# Patient Record
Sex: Female | Born: 1990 | Race: Asian | Hispanic: No | Marital: Married | Smoking: Never smoker
Health system: Southern US, Community
[De-identification: ages and names within clinical notes are randomized; demographics above are authoritative.]

## PROBLEM LIST (undated history)

## (undated) DIAGNOSIS — E669 Obesity, unspecified: Secondary | ICD-10-CM

## (undated) DIAGNOSIS — G44209 Tension-type headache, unspecified, not intractable: Secondary | ICD-10-CM

## (undated) HISTORY — DX: Tension-type headache, unspecified, not intractable: G44.209

## (undated) HISTORY — DX: Obesity, unspecified: E66.9

---

## 2012-05-19 ENCOUNTER — Emergency Department (HOSPITAL_COMMUNITY)
Admission: EM | Admit: 2012-05-19 | Discharge: 2012-05-19 | Disposition: A | Discharge status: Home / Self Care | Payer: BC Managed Care – PPO | Source: Home / Self Care | Attending: Family Medicine | Admitting: Family Medicine

## 2012-05-19 ENCOUNTER — Encounter (HOSPITAL_COMMUNITY): Payer: Self-pay

## 2012-05-19 DIAGNOSIS — Z3490 Encounter for supervision of normal pregnancy, unspecified, unspecified trimester: Secondary | ICD-10-CM

## 2012-05-19 DIAGNOSIS — Z3201 Encounter for pregnancy test, result positive: Secondary | ICD-10-CM

## 2012-05-19 DIAGNOSIS — Z331 Pregnant state, incidental: Secondary | ICD-10-CM

## 2012-05-19 LAB — POCT URINALYSIS DIP (DEVICE)
Bilirubin Urine: NEGATIVE
Glucose, UA: NEGATIVE mg/dL
Hgb urine dipstick: NEGATIVE
Nitrite: NEGATIVE
Specific Gravity, Urine: 1.015 (ref 1.005–1.030)

## 2012-05-19 LAB — POCT PREGNANCY, URINE: Preg Test, Ur: POSITIVE — AB

## 2012-05-19 NOTE — ED Notes (Signed)
Desires pregnancy test.  LMP 03-13-12.  Denies bleeding or abdominal pain or concerns

## 2012-05-19 NOTE — ED Provider Notes (Signed)
History     CSN: [id number]  Arrival date & time 05/19/12  1026   First MD Initiated Contact with Patient 05/19/12 1041      Chief Complaint  Patient presents with  . Possible Pregnancy    (Consider location/radiation/quality/duration/timing/severity/associated sxs/prior treatment) Patient is a 21 y.o. female presenting with female genitourinary complaint. The history is provided by the patient and the spouse.  Female GU Problem Primary symptoms include no vaginal bleeding. Primary symptoms comment: lmp 6/28 requesting preg test. There has been no fever. She has missed her period. Her LMP was months ago. There is no concern regarding sexually transmitted diseases. She uses nothing for contraception.    History reviewed. No pertinent past medical history.  History reviewed. No pertinent past surgical history.  No family history on file.  History  Substance Use Topics  . Smoking status: Never Smoker   . Smokeless tobacco: Not on file  . Alcohol Use: No    OB History    Grav Para Term Preterm Abortions TAB SAB Ect Mult Living                  Review of Systems  Constitutional: Negative.   Gastrointestinal: Negative.   Genitourinary: Negative for vaginal bleeding, vaginal discharge and vaginal pain.    Allergies  Review of patient's allergies indicates no known allergies.  Home Medications  No current outpatient prescriptions on file.  BP 106/54  Pulse 64  Temp 98.3 F (36.8 C) (Oral)  Resp 16  SpO2 98%  LMP 03/13/2012  Physical Exam  Nursing note and vitals reviewed. Constitutional: She is oriented to person, place, and time. She appears well-developed and well-nourished.  Abdominal: Soft. Bowel sounds are normal. She exhibits no mass. There is no tenderness.  Neurological: She is alert and oriented to person, place, and time.  Skin: Skin is warm and dry.    ED Course  Procedures (including critical care time)  Labs Reviewed  POCT PREGNANCY, URINE -  Abnormal; Notable for the following:    Preg Test, Ur POSITIVE (*)     All other components within normal limits  POCT URINALYSIS DIP (DEVICE)   No results found.   1. Pregnancy       MDM  upreg--pos        Linna Hoff, MD 05/19/12 1123

## 2012-09-16 NOTE — L&D Delivery Note (Signed)
Nicole Knudsen is a 22 y.o. [id number] presenting at [redacted]w[redacted]d in active labor. She progressed to complete dilation and SVD.  Delivery Note At 1:41 PM a viable female was delivered via Vaginal, Spontaneous Delivery (Presentation: ; Occiput Anterior).  APGAR: 8, 9; weight 7 lb 8.6 oz (3419 g).   Placenta status: Intact, Spontaneous.  Cord: 3 vessels with the following complications: .  Cord pH: n/a  Anesthesia: None  Episiotomy: None Lacerations: 2nd degree Suture Repair: 2.0 vicryl Est. Blood Loss (mL): 300  Mom to postpartum.  Baby to nursery-stable.  Napoleon Form 12/21/2012, 5:11 PM

## 2012-09-23 ENCOUNTER — Encounter: Payer: Self-pay | Admitting: *Deleted

## 2012-09-30 ENCOUNTER — Ambulatory Visit (INDEPENDENT_AMBULATORY_CARE_PROVIDER_SITE_OTHER): Payer: BC Managed Care – PPO | Admitting: Advanced Practice Midwife

## 2012-09-30 ENCOUNTER — Encounter: Payer: Self-pay | Admitting: Advanced Practice Midwife

## 2012-09-30 VITALS — BP 100/63 | Temp 97.1°F | Wt 142.0 lb

## 2012-09-30 DIAGNOSIS — Z3493 Encounter for supervision of normal pregnancy, unspecified, third trimester: Secondary | ICD-10-CM

## 2012-09-30 DIAGNOSIS — Z3480 Encounter for supervision of other normal pregnancy, unspecified trimester: Secondary | ICD-10-CM

## 2012-09-30 LAB — POCT URINALYSIS DIP (DEVICE)
Bilirubin Urine: NEGATIVE
Glucose, UA: NEGATIVE mg/dL
Ketones, ur: NEGATIVE mg/dL
Protein, ur: NEGATIVE mg/dL
Specific Gravity, Urine: 1.02 (ref 1.005–1.030)

## 2012-09-30 NOTE — Progress Notes (Signed)
Pulse: 84

## 2012-09-30 NOTE — Progress Notes (Signed)
Pt is here for NOB visit. Translator with her. NOB labs done and GTT today. Will schedule an anatomy ultrasound. She denies any concerns. +FM, occasional BH contractions, denies any LOF or vaginal bleeding. She does have some white discharge. Denies itching or odor.

## 2012-10-01 LAB — OBSTETRIC PANEL
Antibody Screen: NEGATIVE
Basophils Absolute: 0 10*3/uL (ref 0.0–0.1)
Basophils Relative: 0 % (ref 0–1)
Eosinophils Absolute: 0.3 10*3/uL (ref 0.0–0.7)
HCT: 30.1 % — ABNORMAL LOW (ref 36.0–46.0)
Hemoglobin: 9.5 g/dL — ABNORMAL LOW (ref 12.0–15.0)
MCH: 23.6 pg — ABNORMAL LOW (ref 26.0–34.0)
MCHC: 31.6 g/dL (ref 30.0–36.0)
Monocytes Absolute: 0.7 10*3/uL (ref 0.1–1.0)
Monocytes Relative: 6 % (ref 3–12)
Neutro Abs: 8.3 10*3/uL — ABNORMAL HIGH (ref 1.7–7.7)
RDW: 15.1 % (ref 11.5–15.5)
Rh Type: POSITIVE

## 2012-10-01 LAB — GLUCOSE TOLERANCE, 1 HOUR (50G) W/O FASTING: Glucose, 1 Hour GTT: 94 mg/dL (ref 70–140)

## 2012-10-01 LAB — HIV ANTIBODY (ROUTINE TESTING W REFLEX): HIV: NONREACTIVE

## 2012-10-02 ENCOUNTER — Ambulatory Visit (HOSPITAL_COMMUNITY)
Admission: RE | Admit: 2012-10-02 | Discharge: 2012-10-02 | Disposition: A | Discharge status: Home / Self Care | Payer: BC Managed Care – PPO | Source: Ambulatory Visit | Attending: Advanced Practice Midwife | Admitting: Advanced Practice Midwife

## 2012-10-02 DIAGNOSIS — Z1389 Encounter for screening for other disorder: Secondary | ICD-10-CM | POA: Insufficient documentation

## 2012-10-02 DIAGNOSIS — Z3493 Encounter for supervision of normal pregnancy, unspecified, third trimester: Secondary | ICD-10-CM

## 2012-10-02 DIAGNOSIS — O358XX0 Maternal care for other (suspected) fetal abnormality and damage, not applicable or unspecified: Secondary | ICD-10-CM | POA: Insufficient documentation

## 2012-10-02 DIAGNOSIS — Z363 Encounter for antenatal screening for malformations: Secondary | ICD-10-CM | POA: Insufficient documentation

## 2012-10-03 LAB — CULTURE, OB URINE: Colony Count: 40000

## 2012-10-14 ENCOUNTER — Encounter: Payer: Self-pay | Admitting: Advanced Practice Midwife

## 2012-10-14 ENCOUNTER — Ambulatory Visit (INDEPENDENT_AMBULATORY_CARE_PROVIDER_SITE_OTHER): Payer: BC Managed Care – PPO | Admitting: Family Medicine

## 2012-10-14 VITALS — BP 118/63 | Temp 97.2°F | Wt 140.0 lb

## 2012-10-14 DIAGNOSIS — O0930 Supervision of pregnancy with insufficient antenatal care, unspecified trimester: Secondary | ICD-10-CM | POA: Insufficient documentation

## 2012-10-14 LAB — POCT URINALYSIS DIP (DEVICE)
Bilirubin Urine: NEGATIVE
Glucose, UA: NEGATIVE mg/dL
Ketones, ur: NEGATIVE mg/dL
Protein, ur: NEGATIVE mg/dL
Specific Gravity, Urine: 1.015 (ref 1.005–1.030)

## 2012-10-14 NOTE — Progress Notes (Signed)
Pt has no concerns. Wt loss since last visit. F/U next appt.

## 2012-10-14 NOTE — Patient Instructions (Addendum)
Pregnancy - Third Trimester The third trimester of pregnancy (the last 3 months) is a period of the most rapid growth for you and your baby. The baby approaches a length of 20 inches and a weight of 6 to 10 pounds. The baby is adding on fat and getting ready for life outside your body. While inside, babies have periods of sleeping and waking, suck their thumbs, and hiccups. You can often feel small contractions of the uterus. This is false labor. It is also called Braxton-Hicks contractions. This is like a practice for labor. The usual problems in this stage of pregnancy include more difficulty breathing, swelling of the hands and feet from water retention, and having to urinate more often because of the uterus and baby pressing on your bladder.  PRENATAL EXAMS  Blood work may continue to be done during prenatal exams. These tests are done to check on your health and the probable health of your baby. Blood work is used to follow your blood levels (hemoglobin). Anemia (low hemoglobin) is common during pregnancy. Iron and vitamins are given to help prevent this. You may also continue to be checked for diabetes. Some of the past blood tests may be done again.  The size of the uterus is measured during each visit. This makes sure your baby is growing properly according to your pregnancy dates.  Your blood pressure is checked every prenatal visit. This is to make sure you are not getting toxemia.  Your urine is checked every prenatal visit for infection, diabetes and protein.  Your weight is checked at each visit. This is done to make sure gains are happening at the suggested rate and that you and your baby are growing normally.  Sometimes, an ultrasound is performed to confirm the position and the proper growth and development of the baby. This is a test done that bounces harmless sound waves off the baby so your caregiver can more accurately determine due dates.  Discuss the type of pain medication and  anesthesia you will have during your labor and delivery.  Discuss the possibility and anesthesia if a Cesarean Section might be necessary.  Inform your caregiver if there is any mental or physical violence at home. Sometimes, a specialized non-stress test, contraction stress test and biophysical profile are done to make sure the baby is not having a problem. Checking the amniotic fluid surrounding the baby is called an amniocentesis. The amniotic fluid is removed by sticking a needle into the belly (abdomen). This is sometimes done near the end of pregnancy if an early delivery is required. In this case, it is done to help make sure the baby's lungs are mature enough for the baby to live outside of the womb. If the lungs are not mature and it is unsafe to deliver the baby, an injection of cortisone medication is given to the mother 1 to 2 days before the delivery. This helps the baby's lungs mature and makes it safer to deliver the baby. CHANGES OCCURING IN THE THIRD TRIMESTER OF PREGNANCY Your body goes through many changes during pregnancy. They vary from person to person. Talk to your caregiver about changes you notice and are concerned about.  During the last trimester, you have probably had an increase in your appetite. It is normal to have cravings for certain foods. This varies from person to person and pregnancy to pregnancy.  You may begin to get stretch marks on your hips, abdomen, and breasts. These are normal changes in the body   during pregnancy. There are no exercises or medications to take which prevent this change.  Constipation may be treated with a stool softener or adding bulk to your diet. Drinking lots of fluids, fiber in vegetables, fruits, and whole grains are helpful.  Exercising is also helpful. If you have been very active up until your pregnancy, most of these activities can be continued during your pregnancy. If you have been less active, it is helpful to start an exercise  program such as walking. Consult your caregiver before starting exercise programs.  Avoid all smoking, alcohol, un-prescribed drugs, herbs and "street drugs" during your pregnancy. These chemicals affect the formation and growth of the baby. Avoid chemicals throughout the pregnancy to ensure the delivery of a healthy infant.  Backache, varicose veins and hemorrhoids may develop or get worse.  You will tire more easily in the third trimester, which is normal.  The baby's movements may be stronger and more often.  You may become short of breath easily.  Your belly button may stick out.  A yellow discharge may leak from your breasts called colostrum.  You may have a bloody mucus discharge. This usually occurs a few days to a week before labor begins. HOME CARE INSTRUCTIONS   Keep your caregiver's appointments. Follow your caregiver's instructions regarding medication use, exercise, and diet.  During pregnancy, you are providing food for you and your baby. Continue to eat regular, well-balanced meals. Choose foods such as meat, fish, milk and other low fat dairy products, vegetables, fruits, and whole-grain breads and cereals. Your caregiver will tell you of the ideal weight gain.  A physical sexual relationship may be continued throughout pregnancy if there are no other problems such as early (premature) leaking of amniotic fluid from the membranes, vaginal bleeding, or belly (abdominal) pain.  Exercise regularly if there are no restrictions. Check with your caregiver if you are unsure of the safety of your exercises. Greater weight gain will occur in the last 2 trimesters of pregnancy. Exercising helps:  Control your weight.  Get you in shape for labor and delivery.  You lose weight after you deliver.  Rest a lot with legs elevated, or as needed for leg cramps or low back pain.  Wear a good support or jogging bra for breast tenderness during pregnancy. This may help if worn during  sleep. Pads or tissues may be used in the bra if you are leaking colostrum.  Do not use hot tubs, steam rooms, or saunas.  Wear your seat belt when driving. This protects you and your baby if you are in an accident.  Avoid raw meat, cat litter boxes and soil used by cats. These carry germs that can cause birth defects in the baby.  It is easier to loose urine during pregnancy. Tightening up and strengthening the pelvic muscles will help with this problem. You can practice stopping your urination while you are going to the bathroom. These are the same muscles you need to strengthen. It is also the muscles you would use if you were trying to stop from passing gas. You can practice tightening these muscles up 10 times a set and repeating this about 3 times per day. Once you know what muscles to tighten up, do not perform these exercises during urination. It is more likely to cause an infection by backing up the urine.  Ask for help if you have financial, counseling or nutritional needs during pregnancy. Your caregiver will be able to offer counseling for these   needs as well as refer you for other special needs.  Make a list of emergency phone numbers and have them available.  Plan on getting help from family or friends when you go home from the hospital.  Make a trial run to the hospital.  Take prenatal classes with the father to understand, practice and ask questions about the labor and delivery.  Prepare the baby's room/nursery.  Do not travel out of the city unless it is absolutely necessary and with the advice of your caregiver.  Wear only low or no heal shoes to have better balance and prevent falling. MEDICATIONS AND DRUG USE IN PREGNANCY  Take prenatal vitamins as directed. The vitamin should contain 1 milligram of folic acid. Keep all vitamins out of reach of children. Only a couple vitamins or tablets containing iron may be fatal to a baby or young child when ingested.  Avoid use  of all medications, including herbs, over-the-counter medications, not prescribed or suggested by your caregiver. Only take over-the-counter or prescription medicines for pain, discomfort, or fever as directed by your caregiver. Do not use aspirin, ibuprofen (Motrin, Advil, Nuprin) or naproxen (Aleve) unless OK'd by your caregiver.  Let your caregiver also know about herbs you may be using.  Alcohol is related to a number of birth defects. This includes fetal alcohol syndrome. All alcohol, in any form, should be avoided completely. Smoking will cause low birth rate and premature babies.  Street/illegal drugs are very harmful to the baby. They are absolutely forbidden. A baby born to an addicted mother will be addicted at birth. The baby will go through the same withdrawal an adult does. SEEK MEDICAL CARE IF: You have any concerns or worries during your pregnancy. It is better to call with your questions if you feel they cannot wait, rather than worry about them. DECISIONS ABOUT CIRCUMCISION You may or may not know the sex of your baby. If you know your baby is a boy, it may be time to think about circumcision. Circumcision is the removal of the foreskin of the penis. This is the skin that covers the sensitive end of the penis. There is no proven medical need for this. Often this decision is made on what is popular at the time or based upon religious beliefs and social issues. You can discuss these issues with your caregiver or pediatrician. SEEK IMMEDIATE MEDICAL CARE IF:   An unexplained oral temperature above 102 F (38.9 C) develops, or as your caregiver suggests.  You have leaking of fluid from the vagina (birth canal). If leaking membranes are suspected, take your temperature and tell your caregiver of this when you call.  There is vaginal spotting, bleeding or passing clots. Tell your caregiver of the amount and how many pads are used.  You develop a bad smelling vaginal discharge with  a change in the color from clear to white.  You develop vomiting that lasts more than 24 hours.  You develop chills or fever.  You develop shortness of breath.  You develop burning on urination.  You loose more than 2 pounds of weight or gain more than 2 pounds of weight or as suggested by your caregiver.  You notice sudden swelling of your face, hands, and feet or legs.  You develop belly (abdominal) pain. Round ligament discomfort is a common non-cancerous (benign) cause of abdominal pain in pregnancy. Your caregiver still must evaluate you.  You develop a severe headache that does not go away.  You develop visual   problems, blurred or double vision.  If you have not felt your baby move for more than 1 hour. If you think the baby is not moving as much as usual, eat something with sugar in it and lie down on your left side for an hour. The baby should move at least 4 to 5 times per hour. Call right away if your baby moves less than that.  You fall, are in a car accident or any kind of trauma.  There is mental or physical violence at home. Document Released: 08/27/2001 Document Revised: 11/25/2011 Document Reviewed: 03/01/2009 [employer] Patient Information 2013 [employer] Fetal Monitoring, Fetal Movement Assessment Fetal movement assessment (FMA) is done by the pregnant woman herself by counting and recording the baby's movements over a certain time period. It is done to see if there are problems with the pregnancy and the baby. Identifying and correcting problems may prevent serious problems from developing with the fetus, including fetal loss. Some pregnancies are complicated by the mother's medical problems. Some of these problems are type 1 diabetes mellitus, high blood pressure and other chronic medical illnesses. This is why it is important to monitor the baby before birth.  OTHER TECHNIQUES OF MONITORING YOUR BABY BEFORE BIRTH Several tests are in use. These  include:  Nonstress test (NST). This test monitors the baby's heart rate when the baby moves.  Contraction stress test (CST). This test monitors the baby's heart rate during a contraction of the uterus.  Fetal biophysical profile (BPP). This measures and evaluates 5 observations of the baby:  The nonstress test.  The baby's breathing.  The baby's movements.  The baby's muscle tone.  The amount of amniotic fluid.  Modified BPP. This measures the volume of fluid in different parts of the amniotic sac (amniotic fluid index) and the results of the nonstress test.  Umbilical artery doppler velocimetry. This evaluates the blood flow through the umbilical cord. There are several very serious problems that cannot be predicted or detected with any of the fetal monitoring procedures. These problems include separation (abruption) of the placenta or when the fetus chokes on the umbilical cord (umbilical cord accident). Your caregiver will help you understand your tests and what they mean for you and your baby. It is your responsibility to obtain the results of your test. LET YOUR CAREGIVER KNOW ABOUT:   Any medications you are taking including prescription and over-the-counter drugs, herbs, eye drops and creams.  If you have a fever.  If you have an infection.  If you are sick. RISKS AND COMPLICATIONS  There are no risks or complications to the mother or fetus with FMA. BEFORE THE PROCEDURE  Do not take medications that may decrease or increase the baby's heart rate and/or movements.  Eat a full meal at least 2 hours before the test.  Do not smoke if you are pregnant. If you smoke, stop at least 2 days before the test. It is best not to smoke at all when you are pregnant. PROCEDURE Sometimes, a mother notices her baby moves less before there are problems. Because of this, it is believed that fetal movement checking by the mother (kick counts) is a good way to check the baby before  birth. There are different ways of doing this. Two good ways are:  The woman lies on her side and counts distinct (individual) fetal movements. A feeling of 10 distinct movements in a period of up to 2 hours is considered reassuring. When 10 movements are felt,   you may stop counting.  Women are instructed to count fetal movements for 1 hour, three times per week. The count is good if, after one week, it equals or is over the woman's previously established baseline count. If the count is lower, further checking of your baby is needed. AFTER THE PROCEDURE You may resume your usual activities. HOME CARE INSTRUCTIONS   Follow your caregiver's advice and recommendations.  Be aware of your baby's movements. Are they normal, less than usual or more than usual?  Make and keep the rest of your prenatal appointments. SEEK MEDICAL CARE IF:   You develop a temperature of 100 F (37.8 C) or higher.  You have a bloody mucus discharge from the vagina (a bloody show). SEEK IMMEDIATE MEDICAL CARE IF:   You do not feel the baby move.  You think the baby's movements are too little or too many.  You develop contractions.  You develop vaginal bleeding.  You have belly (abdominal) pain.  You have leaking or a gush of fluid from the vagina. Document Released: 08/23/2002 Document Revised: 11/25/2011 Document Reviewed: 12/26/2008 [employer] Patient Information 2013 [employer].  

## 2012-10-14 NOTE — Progress Notes (Signed)
Pulse: 88

## 2012-11-04 ENCOUNTER — Encounter: Payer: Self-pay | Admitting: Advanced Practice Midwife

## 2012-11-04 ENCOUNTER — Ambulatory Visit (INDEPENDENT_AMBULATORY_CARE_PROVIDER_SITE_OTHER): Payer: BC Managed Care – PPO | Admitting: Advanced Practice Midwife

## 2012-11-04 VITALS — BP 105/56 | Temp 97.1°F | Wt 141.7 lb

## 2012-11-04 DIAGNOSIS — O0930 Supervision of pregnancy with insufficient antenatal care, unspecified trimester: Secondary | ICD-10-CM

## 2012-11-04 DIAGNOSIS — O9989 Other specified diseases and conditions complicating pregnancy, childbirth and the puerperium: Secondary | ICD-10-CM

## 2012-11-04 DIAGNOSIS — O2613 Low weight gain in pregnancy, third trimester: Secondary | ICD-10-CM

## 2012-11-04 LAB — POCT URINALYSIS DIP (DEVICE)
Bilirubin Urine: NEGATIVE
Glucose, UA: NEGATIVE mg/dL
Hgb urine dipstick: NEGATIVE
Specific Gravity, Urine: 1.015 (ref 1.005–1.030)
Urobilinogen, UA: 0.2 mg/dL (ref 0.0–1.0)

## 2012-11-04 NOTE — Patient Instructions (Signed)
Pregnancy - Third Trimester  The third trimester of pregnancy (the last 3 months) is a period of the most rapid growth for you and your baby. The baby approaches a length of 20 inches and a weight of 6 to 10 pounds. The baby is adding on fat and getting ready for life outside your body. While inside, babies have periods of sleeping and waking, suck their thumbs, and hiccups. You can often feel small contractions of the uterus. This is false labor. It is also called Braxton-Hicks contractions. This is like a practice for labor. The usual problems in this stage of pregnancy include more difficulty breathing, swelling of the hands and feet from water retention, and having to urinate more often because of the uterus and baby pressing on your bladder.   PRENATAL EXAMS  · Blood work may continue to be done during prenatal exams. These tests are done to check on your health and the probable health of your baby. Blood work is used to follow your blood levels (hemoglobin). Anemia (low hemoglobin) is common during pregnancy. Iron and vitamins are given to help prevent this. You may also continue to be checked for diabetes. Some of the past blood tests may be done again.  · The size of the uterus is measured during each visit. This makes sure your baby is growing properly according to your pregnancy dates.  · Your blood pressure is checked every prenatal visit. This is to make sure you are not getting toxemia.  · Your urine is checked every prenatal visit for infection, diabetes and protein.  · Your weight is checked at each visit. This is done to make sure gains are happening at the suggested rate and that you and your baby are growing normally.  · Sometimes, an ultrasound is performed to confirm the position and the proper growth and development of the baby. This is a test done that bounces harmless sound waves off the baby so your caregiver can more accurately determine due dates.  · Discuss the type of pain medication and  anesthesia you will have during your labor and delivery.  · Discuss the possibility and anesthesia if a Cesarean Section might be necessary.  · Inform your caregiver if there is any mental or physical violence at home.  Sometimes, a specialized non-stress test, contraction stress test and biophysical profile are done to make sure the baby is not having a problem. Checking the amniotic fluid surrounding the baby is called an amniocentesis. The amniotic fluid is removed by sticking a needle into the belly (abdomen). This is sometimes done near the end of pregnancy if an early delivery is required. In this case, it is done to help make sure the baby's lungs are mature enough for the baby to live outside of the womb. If the lungs are not mature and it is unsafe to deliver the baby, an injection of cortisone medication is given to the mother 1 to 2 days before the delivery. This helps the baby's lungs mature and makes it safer to deliver the baby.  CHANGES OCCURING IN THE THIRD TRIMESTER OF PREGNANCY  Your body goes through many changes during pregnancy. They vary from person to person. Talk to your caregiver about changes you notice and are concerned about.  · During the last trimester, you have probably had an increase in your appetite. It is normal to have cravings for certain foods. This varies from person to person and pregnancy to pregnancy.  · You may begin to   get stretch marks on your hips, abdomen, and breasts. These are normal changes in the body during pregnancy. There are no exercises or medications to take which prevent this change.  · Constipation may be treated with a stool softener or adding bulk to your diet. Drinking lots of fluids, fiber in vegetables, fruits, and whole grains are helpful.  · Exercising is also helpful. If you have been very active up until your pregnancy, most of these activities can be continued during your pregnancy. If you have been less active, it is helpful to start an exercise  program such as walking. Consult your caregiver before starting exercise programs.  · Avoid all smoking, alcohol, un-prescribed drugs, herbs and "street drugs" during your pregnancy. These chemicals affect the formation and growth of the baby. Avoid chemicals throughout the pregnancy to ensure the delivery of a healthy infant.  · Backache, varicose veins and hemorrhoids may develop or get worse.  · You will tire more easily in the third trimester, which is normal.  · The baby's movements may be stronger and more often.  · You may become short of breath easily.  · Your belly button may stick out.  · A yellow discharge may leak from your breasts called colostrum.  · You may have a bloody mucus discharge. This usually occurs a few days to a week before labor begins.  HOME CARE INSTRUCTIONS   · Keep your caregiver's appointments. Follow your caregiver's instructions regarding medication use, exercise, and diet.  · During pregnancy, you are providing food for you and your baby. Continue to eat regular, well-balanced meals. Choose foods such as meat, fish, milk and other low fat dairy products, vegetables, fruits, and whole-grain breads and cereals. Your caregiver will tell you of the ideal weight gain.  · A physical sexual relationship may be continued throughout pregnancy if there are no other problems such as early (premature) leaking of amniotic fluid from the membranes, vaginal bleeding, or belly (abdominal) pain.  · Exercise regularly if there are no restrictions. Check with your caregiver if you are unsure of the safety of your exercises. Greater weight gain will occur in the last 2 trimesters of pregnancy. Exercising helps:  · Control your weight.  · Get you in shape for labor and delivery.  · You lose weight after you deliver.  · Rest a lot with legs elevated, or as needed for leg cramps or low back pain.  · Wear a good support or jogging bra for breast tenderness during pregnancy. This may help if worn during  sleep. Pads or tissues may be used in the bra if you are leaking colostrum.  · Do not use hot tubs, steam rooms, or saunas.  · Wear your seat belt when driving. This protects you and your baby if you are in an accident.  · Avoid raw meat, cat litter boxes and soil used by cats. These carry germs that can cause birth defects in the baby.  · It is easier to loose urine during pregnancy. Tightening up and strengthening the pelvic muscles will help with this problem. You can practice stopping your urination while you are going to the bathroom. These are the same muscles you need to strengthen. It is also the muscles you would use if you were trying to stop from passing gas. You can practice tightening these muscles up 10 times a set and repeating this about 3 times per day. Once you know what muscles to tighten up, do not perform these   exercises during urination. It is more likely to cause an infection by backing up the urine.  · Ask for help if you have financial, counseling or nutritional needs during pregnancy. Your caregiver will be able to offer counseling for these needs as well as refer you for other special needs.  · Make a list of emergency phone numbers and have them available.  · Plan on getting help from family or friends when you go home from the hospital.  · Make a trial run to the hospital.  · Take prenatal classes with the father to understand, practice and ask questions about the labor and delivery.  · Prepare the baby's room/nursery.  · Do not travel out of the city unless it is absolutely necessary and with the advice of your caregiver.  · Wear only low or no heal shoes to have better balance and prevent falling.  MEDICATIONS AND DRUG USE IN PREGNANCY  · Take prenatal vitamins as directed. The vitamin should contain 1 milligram of folic acid. Keep all vitamins out of reach of children. Only a couple vitamins or tablets containing iron may be fatal to a baby or young child when ingested.  · Avoid use  of all medications, including herbs, over-the-counter medications, not prescribed or suggested by your caregiver. Only take over-the-counter or prescription medicines for pain, discomfort, or fever as directed by your caregiver. Do not use aspirin, ibuprofen (Motrin®, Advil®, Nuprin®) or naproxen (Aleve®) unless OK'd by your caregiver.  · Let your caregiver also know about herbs you may be using.  · Alcohol is related to a number of birth defects. This includes fetal alcohol syndrome. All alcohol, in any form, should be avoided completely. Smoking will cause low birth rate and premature babies.  · Street/illegal drugs are very harmful to the baby. They are absolutely forbidden. A baby born to an addicted mother will be addicted at birth. The baby will go through the same withdrawal an adult does.  SEEK MEDICAL CARE IF:  You have any concerns or worries during your pregnancy. It is better to call with your questions if you feel they cannot wait, rather than worry about them.  DECISIONS ABOUT CIRCUMCISION  You may or may not know the sex of your baby. If you know your baby is a boy, it may be time to think about circumcision. Circumcision is the removal of the foreskin of the penis. This is the skin that covers the sensitive end of the penis. There is no proven medical need for this. Often this decision is made on what is popular at the time or based upon religious beliefs and social issues. You can discuss these issues with your caregiver or pediatrician.  SEEK IMMEDIATE MEDICAL CARE IF:   · An unexplained oral temperature above 102° F (38.9° C) develops, or as your caregiver suggests.  · You have leaking of fluid from the vagina (birth canal). If leaking membranes are suspected, take your temperature and tell your caregiver of this when you call.  · There is vaginal spotting, bleeding or passing clots. Tell your caregiver of the amount and how many pads are used.  · You develop a bad smelling vaginal discharge with  a change in the color from clear to white.  · You develop vomiting that lasts more than 24 hours.  · You develop chills or fever.  · You develop shortness of breath.  · You develop burning on urination.  · You loose more than 2 pounds of weight   or gain more than 2 pounds of weight or as suggested by your caregiver.  · You notice sudden swelling of your face, hands, and feet or legs.  · You develop belly (abdominal) pain. Round ligament discomfort is a common non-cancerous (benign) cause of abdominal pain in pregnancy. Your caregiver still must evaluate you.  · You develop a severe headache that does not go away.  · You develop visual problems, blurred or double vision.  · If you have not felt your baby move for more than 1 hour. If you think the baby is not moving as much as usual, eat something with sugar in it and lie down on your left side for an hour. The baby should move at least 4 to 5 times per hour. Call right away if your baby moves less than that.  · You fall, are in a car accident or any kind of trauma.  · There is mental or physical violence at home.  Document Released: 08/27/2001 Document Revised: 11/25/2011 Document Reviewed: 03/01/2009  [employer]® Patient Information ©2013 [employer].

## 2012-11-04 NOTE — Progress Notes (Signed)
Doing well. Denies pain to me. Reviewed where to go if she has problems. Low weight gain and decreased FH. Will check growth [place]

## 2012-11-04 NOTE — Progress Notes (Signed)
Pulse- 77   Pain-lower back

## 2012-11-12 ENCOUNTER — Ambulatory Visit (HOSPITAL_COMMUNITY)
Admission: RE | Admit: 2012-11-12 | Discharge: 2012-11-12 | Disposition: A | Discharge status: Home / Self Care | Payer: BC Managed Care – PPO | Source: Ambulatory Visit | Attending: Advanced Practice Midwife | Admitting: Advanced Practice Midwife

## 2012-11-12 DIAGNOSIS — Z3689 Encounter for other specified antenatal screening: Secondary | ICD-10-CM | POA: Insufficient documentation

## 2012-11-12 DIAGNOSIS — O2613 Low weight gain in pregnancy, third trimester: Secondary | ICD-10-CM

## 2012-11-16 ENCOUNTER — Encounter: Payer: Self-pay | Admitting: Advanced Practice Midwife

## 2012-11-16 DIAGNOSIS — O2610 Low weight gain in pregnancy, unspecified trimester: Secondary | ICD-10-CM | POA: Insufficient documentation

## 2012-11-16 HISTORY — DX: Low weight gain in pregnancy, unspecified trimester: O26.10

## 2012-11-18 ENCOUNTER — Encounter: Payer: Self-pay | Admitting: Family

## 2012-11-18 ENCOUNTER — Other Ambulatory Visit: Payer: Self-pay | Admitting: Advanced Practice Midwife

## 2012-11-18 ENCOUNTER — Ambulatory Visit (INDEPENDENT_AMBULATORY_CARE_PROVIDER_SITE_OTHER): Payer: BC Managed Care – PPO | Admitting: Family

## 2012-11-18 VITALS — BP 111/71 | Temp 97.3°F | Wt 144.8 lb

## 2012-11-18 DIAGNOSIS — O0933 Supervision of pregnancy with insufficient antenatal care, third trimester: Secondary | ICD-10-CM

## 2012-11-18 DIAGNOSIS — O0930 Supervision of pregnancy with insufficient antenatal care, unspecified trimester: Secondary | ICD-10-CM

## 2012-11-18 LAB — POCT URINALYSIS DIP (DEVICE)
Glucose, UA: NEGATIVE mg/dL
Ketones, ur: NEGATIVE mg/dL
Protein, ur: NEGATIVE mg/dL
Specific Gravity, Urine: 1.015 (ref 1.005–1.030)
Urobilinogen, UA: 0.2 mg/dL (ref 0.0–1.0)

## 2012-11-18 LAB — OB RESULTS CONSOLE GBS: GBS: POSITIVE

## 2012-11-18 NOTE — Progress Notes (Signed)
Pulse- 74 Patient reports some abdominal pain

## 2012-11-18 NOTE — Progress Notes (Signed)
Pt with no complaints today, reporting normal fetal movement and no bleeding, loss of fluid, or contractions. Gained 3 lbs since 2/19 visit. Today, GC/Chlamydia via speculum exam and GBS screen performed. Pt would like [place] to call husb phone at [phone] if abnormal. Visit translated by interpreter.

## 2012-11-23 LAB — CULTURE, BETA STREP (GROUP B ONLY)

## 2012-11-25 ENCOUNTER — Ambulatory Visit (INDEPENDENT_AMBULATORY_CARE_PROVIDER_SITE_OTHER): Payer: BC Managed Care – PPO | Admitting: Advanced Practice Midwife

## 2012-11-25 ENCOUNTER — Other Ambulatory Visit: Payer: Self-pay | Admitting: Family Medicine

## 2012-11-25 ENCOUNTER — Encounter: Payer: Self-pay | Admitting: Advanced Practice Midwife

## 2012-11-25 VITALS — BP 99/61 | Temp 97.3°F | Wt 145.5 lb

## 2012-11-25 DIAGNOSIS — Z3483 Encounter for supervision of other normal pregnancy, third trimester: Secondary | ICD-10-CM

## 2012-11-25 DIAGNOSIS — Z3480 Encounter for supervision of other normal pregnancy, unspecified trimester: Secondary | ICD-10-CM

## 2012-11-25 LAB — POCT URINALYSIS DIP (DEVICE)
Bilirubin Urine: NEGATIVE
Glucose, UA: NEGATIVE mg/dL
Nitrite: NEGATIVE
Specific Gravity, Urine: 1.015 (ref 1.005–1.030)
Urobilinogen, UA: 0.2 mg/dL (ref 0.0–1.0)

## 2012-11-25 NOTE — Progress Notes (Signed)
No complaint, feeling well. Discussed GBS +, and handout given.

## 2012-11-25 NOTE — Patient Instructions (Addendum)
Nhi?m Trng Khu?n Lin C?u Nhm B Trong Khi Mang [place]  (Group B Streptococcus Infection During Pregnancy) Khu?n lin c?u Nhm B (GBS) l m?t lo?i vi khu?n th??ng ???c tm th?y ? ph? n? kh?e m?nh. GBS th??ng khng gy ra b?t k? tri?u ch?ng ho?c t?n h?i no cho ph? n? tr??ng thnh kh?e m?nh, nh?ng vi khu?n ny c th? lm cho em b b? b?nh r?t n?ng n?u n ly sang em b trong khi sinh. GBS khng ph?i l b?nh ly truy?n qua ???ng tnh d?c (STD). GBS khc v?i khu?n lin c?u Nhm A, vi khu?n gy b?nh "vim h?ng khu?n lin c?u".  NGUYN NHN  Vi khu?n GBS c th? ???c tm th?y trong ???ng ru?t, c? quan sinh s?n v ???ng ti?t ni?u c?a ph? n?. N c?ng c th? ???c tm th?y trong ???ng sinh d?c n?, th??ng xuyn nh?t trong cc khu v?c m ??o v tr?c trng.  TRI?U CH?NG  Trong thai k?, GBS c th? ? nh?ng n?i sau ?y:  ???ng b? ph?n sinh d?c m khng c tri?u ch?ng.  Tr?c trng m khng c tri?u ch?ng.  N??c ti?u c ho?c khng c tri?u ch?ng (nhi?m trng khng c tri?u ch?ng).  Cc tri?u ch?ng c th? bao g?m ?au, t?n su?t, m?c ?? c?p bch v ra mu khi ?i ti?u (vim bng quang). Ph? n? mang thai b? nhi?m GBS c nguy c? gia t?ng:   ?au ?? v ?? s?m.  V? ?i ko di.  Nhi?m trng ? cc b? ph?n sau:  Bng quang.  Th?n (vim b? th?n).  Ti n??c ho?c nhau thai (nhi?m trng ?i).  T? cung sau khi sinh. Tr? m?i sinh b? nhi?m GBS c th? t?ng nguy c?:   Nhi?m khu?n ph?i (vim ph?i).  Nhi?m trng mu (nhi?m khu?n huy?t).  Nhi?m trng mng no v t?y s?ng (vim mng no). CH?N ?ON  Ch?n ?on nhi?m GBS ???c th?c hi?n b?ng cc xt nghi?m sng l?c ???c th?c hi?n ? tu?n th? 35 ??n 37 c?a thai k?[place] nghi?m (c?y) th?t d? dng, dng m?t t?m bng qu?t m ??o v tr?c trng. M?u n??c ti?u c?ng c th? ???c ki?m tra ?? tm vi khu?n. Ni chuy?n v?i chuyn gia ch?m [place] y t? c?a b?n v? m?t k? ho?ch ?au ?? n?u xt nghi?m cho th?y b?n mang vi khu?n GBS.  ?I?U TR?  N?u k?t qu? c?y d??ng tnh, cho bi?t b? nhi?m GBS, b?n  c th? s? ???c ?i?u tr? thu?c khng sinh trong qu trnh ?au ??. Cch ny s? gip ng?n khng cho GBS ly sang em b c?a b?n. Khng sinh ny ch? c tc d?ng khi chng ???c dng trong qu trnh ?au ??. N?u vi?c ?i?u tr? ???c ch? ??nh s?m h?n trong th?i k? mang thai, vi khu?n ny c th? pht tri?n tr? l?i v xu?t hi?n trong qu trnh ?au ??. Ni cho chuyn gia ch?m [place] y t? c?a b?n bi?t n?u b?n b? d? ?ng v?i penicillin ho?c cc khng sinh khc. Khng sinh c?ng ???c ch? ??nh n?u:   Nghi ng? nhi?m trng mng ?i (vim mng ?i).  B?t ??u ?au ?? ho?c v? mng ?i tr??c 37 tu?n c?a [place] k? v c nguy c? l?n c?a vi?c sinh em b.  C ti?n s? sinh em b b? nhi?m GBS.  Tnh tr?ng m?u c?y khng r (m?u c?y khng ???c th?c hi?n ho?c k?t qu? khng c s?n) v b?:  S?t trong  qu trnh ?au ??.  ?au ?? tr??c th?i h?n (t h?n 37 tu?n tu?i [place]).  V? ?i ko di (17 gi? ho?c nhi?u h?n) ?i?u tr? cho m? trong qu trnh ?au ?? khng ???c khuy?n co khi:  Vi?c M? ?? theo k? ho?ch ???c th?c hi?n v khng ?au ?? hay v? ?i. ?i?u ny ?ng ngay c? khi ng??i m? ? xt nghi?m d??ng tnh v?i GBS.  C m?t m?u c?y m tnh khi ki?m tra GBS trong khi mang [place], b?t k? cc y?u t? nguy c? trong qu trnh ?au ??. Tr? s? sinh s? nh?n ???c thu?c khng sinh n?u b ???c xt nghi?m d??ng tnh v?i GBS ho?c c cc d?u hi?u v tri?u ch?ng nhi?m GBS. Khng nn th??ng xuyn s? d?ng khng sinh cho tr? s? sinh c m? ???c ?i?u tr? khng sinh trong qu trnh ?au ??.  H??NG D?N CH?M [place] T?I NH   S? d?ng t?t c? thu?c khng sinh theo ch? ??nh c?a chuyn gia ch?m [place] y t?.  Ch? s? d?ng thu?c theo ch? d?n c?a chuyn gia ch?m [place] y t?.  Ti?p t?c v?i cc l?n h?n khm v ch?m [place] tr??c khi sinh.  [place] tr? l?i cho cu?c h?n ti?p theo v c?y.  Th?c hi?n theo h??ng d?n c?a chuyn gia ch?m Index y t?. HY THAM V?N V?I CHUYN GIA Y T? N?U:   ?au khi ?i ti?u.  Th??ng xuyn ?i ti?u.  ?i ti?u ra mu. HY NGAY L?P T?C THAM V?N V?I CHUYN GIA Y T?  N?U:  B?n b? s?t.  B?n b? ?au ? l?ng, hng ho?c t? cung.  B?n b? ?n l?nh.  B?n b? s?ng b?ng (c?ng) hay ?au.  B?n c cc c?n ?au ?? (c?n co th?t) c? 10 pht m?t l?n ho?c th??ng xuyn h?n.  B?n ?ang b? ch?y d?ch ho?c mu t? m ??o.  B?n b? t?c vng ch?u, c?m gic nh? em b ?ang ??y xu?ng.  B?n b? ?au m ? vng th?t l?ng.  B?n b? ?au b?ng c?m gic nh? trong k? kinh nguy?t.  B?n b? ?au b?ng c ho?c khng c tiu ch?y.  B?n lin t?c b? nn m?a v tiu ch?y.  B?n b? kh th?.  B?n pht tri?n s? nh?m l?n.  B?n b? c?ng ng??i ho?c c?. ??M B?O B?N:   Hi?u cc h??ng d?n ny.  S? theo di tnh tr?ng c?a mnh.  S? yu c?u tr? gip ngay l?p t?c n?u b?n c?m th?y khng kh?e ho?c tnh tr?ng tr? nn t?i h?n. Document Released: 12/07/2010 Document Revised: 11/25/2011 [facility] Patient Information 2013 [place], [place].

## 2012-11-25 NOTE — Progress Notes (Signed)
Pulse: 68

## 2012-11-26 ENCOUNTER — Encounter: Payer: Self-pay | Admitting: Family

## 2012-12-02 ENCOUNTER — Other Ambulatory Visit: Payer: Self-pay | Admitting: Obstetrics and Gynecology

## 2012-12-02 ENCOUNTER — Ambulatory Visit (INDEPENDENT_AMBULATORY_CARE_PROVIDER_SITE_OTHER): Payer: BC Managed Care – PPO | Admitting: Advanced Practice Midwife

## 2012-12-02 VITALS — BP 113/74 | Temp 96.8°F | Wt 145.1 lb

## 2012-12-02 DIAGNOSIS — O0933 Supervision of pregnancy with insufficient antenatal care, third trimester: Secondary | ICD-10-CM

## 2012-12-02 DIAGNOSIS — O0930 Supervision of pregnancy with insufficient antenatal care, unspecified trimester: Secondary | ICD-10-CM

## 2012-12-02 LAB — POCT URINALYSIS DIP (DEVICE)
Bilirubin Urine: NEGATIVE
Glucose, UA: NEGATIVE mg/dL
Ketones, ur: NEGATIVE mg/dL
Nitrite: NEGATIVE

## 2012-12-02 NOTE — Patient Instructions (Signed)
Cc C?n G Braxton Hicks (Braxton Hicks Contractions) B?n ?ang chuy?n d? gi?. [place] k? th??ng km theo cc c?n g t? cung trong su?t qu trnh [place]. G?n cu?i [place] k? cc c?n g ny Deberah Pelton) c th? tr? nn m?nh h?n. ?y khng ph?i l chuy?n d? th?c s? b?i v cc c?n g ny khng d?n ??n vi?c m? (gin n?) v lm m?ng c? t? cung. ?i khi chng kh phn bi?t v?i chuy?n d? th?c s? b?i v cc c?n g ny c th? m?nh. Khng nn c?m th?y b?i r?i n?u ??n b?nh vi?n v?i chuy?n d? gi?. Th?nh tho?ng cch duy nh?t ?? kh?ng ??nh c chuy?n d? th?c s? hay khng l theo di cc bi?n ??i c? t? cung. LM TH? NO ?? CH? RA S? KHC BI?T GI?A CHUY?N D? TH?C S? V CHUY?N D? GI?  Chuy?n d? gi?.  Cc c?n g c?a chuy?n d? gi? th??ng ng?n h?n, khng ??u v khng m?nh b?ng cc c?n g c?a chuy?n d? th?c s?.  Cc c?n g ny th??ng ???c c?m th?y ? vng b?ng d??i v hng.  Chng c th? h?t v?i vi?c ?i b? xung quanh ho?c thay ??i v? tr trong khi n?m xu?ng.  Chng s? y?u h?n v th?i gian ko di ng?n h?n theo th?i gian.  Nh?ng c?n g ny th??ng l khng ??u.  Chng th??ng khng di?n ti?n m?nh d?n ln, ??u v g?n nhau h?n nh? trong chuy?n d? th?c s?.  Chuy?n d? th?t.  Cc c?n g trong chuy?n d? th?c s? ko di t? 30 ??n 55 giy, tr? nn r?t ??u, th??ng di?n ti?n m?nh h?n, v t?ng d?n v? t?n su?t.  Chng khng m?t ?i khi ?i b?.  S? kh ch?u th??ng ???c c?m nh?n ? pha trn t? cung v lan d?n xu?ng b?ng v l?ng bn d??i.  Chuy?n d? th?c s? c th? ???c xc ??nh b?i Bc s? qua th?m khm. Vi?c th?m khm ny s? cho th?y c? t? cung ?ang m? v m?ng d?n. N?u khng c cc v?n ?? ti?n s?n hay cc v?n ?? v? s?c kh?e khc km theo trong qu trnh [place], ???c v? nh khi c chuy?n d? gi? v ch? ??i s? kh?i pht c?a chuy?n d? th?c s? l hon ton an ton. H??NG D?N CH?M Lena T?I NH  Duy tr cc v?n ??ng v cc ch? d?n th??ng l?.  Dng thu?c theo h??ng d?n.  Gi? ?ng l?ch khm thai ??u ??n.  ?n u?ng nh? n?u ngh? r?ng  s?p s?a chuy?n d?.  N?u cc c?n co th?t BH lm cho b?n kh ch?u:  Thay ??i t? th? ho?t ??ng c?a b?n t? n?m ho?c ngh? ng?i sang ?i b?/?i b? ?? ngh? ng?i.  Ng?i v th? gin trong b?n t?m n??c ?m.  U?ng 2 ??n 3 ly n??c. M?t n??c c th? gy ra cc c?n co th?t BH.  Ht th? ch?m v su vi l?n m?i gi?. NH?P VI?N NGAY L?P T?C N?U:  Cc c?n g ti?p t?c tr? nn m?nh h?n, ??u h?n v g?n nhau h?n.  N??c ?i v?t ra t? m ??o hay b? ?i.  Nhi?t ?? ?o d??i l??i cao h?n 102 F (38.9 C), hay theo ?? ngh? c?a Bc s?.  ?i c?u phn nh?y c nhu?m mu.  Xu?t huy?t m ??o.  ?au b?ng lin t?c.  B?n b? ?au th?t l?ng m tr??c ? ch?a bao gi? b?.  B?n c?m  th?y ??u em b ??y xu?ng gy p l?c vng ch?u.  Em b khng c? ??ng nhi?u nh? m?i khi. Document Released: 09/02/2005 Document Revised: 11/25/2011 [facility] Patient Information 2013 [place], [place].   Fetal Movement Counts Patient Name: __________________________________________________ Patient Due Date: ____________________ Melody Haver counts is highly recommended in high risk pregnancies, but it is a good idea for every pregnant woman to do. Start counting fetal movements at 28 weeks of the pregnancy. Fetal movements increase after eating a full meal or eating or drinking something sweet (the blood sugar is higher). It is also important to drink plenty of fluids (well hydrated) before doing the count. Lie on your left side because it helps with the circulation or you can sit in a comfortable chair with your arms over your belly (abdomen) with no distractions around you. DOING THE COUNT  Try to do the count the same time of day each time you do it.  Mark the day and time, then see how long it takes for you to feel 10 movements (kicks, flutters, swishes, rolls). You should have at least 10 movements within 2 hours. You will most likely feel 10 movements in much less than 2 hours. If you do not, wait an hour and count again. After a couple of days you will see  a pattern.  What you are looking for is a change in the pattern or not enough counts in 2 hours. Is it taking longer in time to reach 10 movements? SEEK MEDICAL CARE IF:  You feel less than 10 counts in 2 hours. Tried twice.  No movement in one hour.  The pattern is changing or taking longer each day to reach 10 counts in 2 hours.  You feel the baby is not moving as it usually does. Date: ____________ Movements: ____________ Start time: ____________ Doreatha Martin time: ____________  Date: ____________ Movements: ____________ Start time: ____________ Doreatha Martin time: ____________ Date: ____________ Movements: ____________ Start time: ____________ Doreatha Martin time: ____________ Date: ____________ Movements: ____________ Start time: ____________ Doreatha Martin time: ____________ Date: ____________ Movements: ____________ Start time: ____________ Doreatha Martin time: ____________ Date: ____________ Movements: ____________ Start time: ____________ Doreatha Martin time: ____________ Date: ____________ Movements: ____________ Start time: ____________ Doreatha Martin time: ____________ Date: ____________ Movements: ____________ Start time: ____________ Doreatha Martin time: ____________  Date: ____________ Movements: ____________ Start time: ____________ Doreatha Martin time: ____________ Date: ____________ Movements: ____________ Start time: ____________ Doreatha Martin time: ____________ Date: ____________ Movements: ____________ Start time: ____________ Doreatha Martin time: ____________ Date: ____________ Movements: ____________ Start time: ____________ Doreatha Martin time: ____________ Date: ____________ Movements: ____________ Start time: ____________ Doreatha Martin time: ____________ Date: ____________ Movements: ____________ Start time: ____________ Doreatha Martin time: ____________ Date: ____________ Movements: ____________ Start time: ____________ Doreatha Martin time: ____________  Date: ____________ Movements: ____________ Start time: ____________ Doreatha Martin time: ____________ Date: ____________  Movements: ____________ Start time: ____________ Doreatha Martin time: ____________ Date: ____________ Movements: ____________ Start time: ____________ Doreatha Martin time: ____________ Date: ____________ Movements: ____________ Start time: ____________ Doreatha Martin time: ____________ Date: ____________ Movements: ____________ Start time: ____________ Doreatha Martin time: ____________ Date: ____________ Movements: ____________ Start time: ____________ Doreatha Martin time: ____________ Date: ____________ Movements: ____________ Start time: ____________ Doreatha Martin time: ____________  Date: ____________ Movements: ____________ Start time: ____________ Doreatha Martin time: ____________ Date: ____________ Movements: ____________ Start time: ____________ Doreatha Martin time: ____________ Date: ____________ Movements: ____________ Start time: ____________ Doreatha Martin time: ____________ Date: ____________ Movements: ____________ Start time: ____________ Doreatha Martin time: ____________ Date: ____________ Movements: ____________ Start time: ____________ Doreatha Martin time: ____________ Date: ____________ Movements: ____________ Start time: ____________ Doreatha Martin time: ____________ Date: ____________ Movements: ____________ Start time: ____________ Doreatha Martin time:  ____________  Date: ____________ Movements: ____________ Start time: ____________ Doreatha Martin time: ____________ Date: ____________ Movements: ____________ Start time: ____________ Doreatha Martin time: ____________ Date: ____________ Movements: ____________ Start time: ____________ Doreatha Martin time: ____________ Date: ____________ Movements: ____________ Start time: ____________ Doreatha Martin time: ____________ Date: ____________ Movements: ____________ Start time: ____________ Doreatha Martin time: ____________ Date: ____________ Movements: ____________ Start time: ____________ Doreatha Martin time: ____________ Date: ____________ Movements: ____________ Start time: ____________ Doreatha Martin time: ____________  Date: ____________ Movements: ____________ Start time:  ____________ Doreatha Martin time: ____________ Date: ____________ Movements: ____________ Start time: ____________ Doreatha Martin time: ____________ Date: ____________ Movements: ____________ Start time: ____________ Doreatha Martin time: ____________ Date: ____________ Movements: ____________ Start time: ____________ Doreatha Martin time: ____________ Date: ____________ Movements: ____________ Start time: ____________ Doreatha Martin time: ____________ Date: ____________ Movements: ____________ Start time: ____________ Doreatha Martin time: ____________ Date: ____________ Movements: ____________ Start time: ____________ Doreatha Martin time: ____________  Date: ____________ Movements: ____________ Start time: ____________ Doreatha Martin time: ____________ Date: ____________ Movements: ____________ Start time: ____________ Doreatha Martin time: ____________ Date: ____________ Movements: ____________ Start time: ____________ Doreatha Martin time: ____________ Date: ____________ Movements: ____________ Start time: ____________ Doreatha Martin time: ____________ Date: ____________ Movements: ____________ Start time: ____________ Doreatha Martin time: ____________ Date: ____________ Movements: ____________ Start time: ____________ Doreatha Martin time: ____________ Date: ____________ Movements: ____________ Start time: ____________ Doreatha Martin time: ____________  Date: ____________ Movements: ____________ Start time: ____________ Doreatha Martin time: ____________ Date: ____________ Movements: ____________ Start time: ____________ Doreatha Martin time: ____________ Date: ____________ Movements: ____________ Start time: ____________ Doreatha Martin time: ____________ Date: ____________ Movements: ____________ Start time: ____________ Doreatha Martin time: ____________ Date: ____________ Movements: ____________ Start time: ____________ Doreatha Martin time: ____________ Date: ____________ Movements: ____________ Start time: ____________ Doreatha Martin time: ____________ Document Released: 10/02/2006 Document Revised: 11/25/2011 Document Reviewed: 04/04/2009 [employer]  Patient Information 2013 [place] [employer].

## 2012-12-02 NOTE — Progress Notes (Signed)
Increased BH. Declines exam. Prepreg weight unknown.

## 2012-12-09 ENCOUNTER — Ambulatory Visit (INDEPENDENT_AMBULATORY_CARE_PROVIDER_SITE_OTHER): Payer: BC Managed Care – PPO | Admitting: Obstetrics and Gynecology

## 2012-12-09 VITALS — BP 114/65 | Temp 97.5°F | Wt 147.0 lb

## 2012-12-09 DIAGNOSIS — Z3483 Encounter for supervision of other normal pregnancy, third trimester: Secondary | ICD-10-CM

## 2012-12-09 DIAGNOSIS — Z3480 Encounter for supervision of other normal pregnancy, unspecified trimester: Secondary | ICD-10-CM

## 2012-12-09 LAB — POCT URINALYSIS DIP (DEVICE)
Bilirubin Urine: NEGATIVE
Ketones, ur: NEGATIVE mg/dL
Specific Gravity, Urine: 1.02 (ref 1.005–1.030)

## 2012-12-09 NOTE — Progress Notes (Signed)
Doing well. Abd discomfort is with FM only. Denies GERD sx or UCs. S/sx labor reviewed

## 2012-12-09 NOTE — Progress Notes (Signed)
Pulse 79 C/o intermittent pain in the epigastric area.

## 2012-12-09 NOTE — Patient Instructions (Signed)
Pregnancy - Third Trimester  The third trimester of pregnancy (the last 3 months) is a period of the most rapid growth for you and your baby. The baby approaches a length of 20 inches and a weight of 6 to 10 pounds. The baby is adding on fat and getting ready for life outside your body. While inside, babies have periods of sleeping and waking, suck their thumbs, and hiccups. You can often feel small contractions of the uterus. This is false labor. It is also called Braxton-Hicks contractions. This is like a practice for labor. The usual problems in this stage of pregnancy include more difficulty breathing, swelling of the hands and feet from water retention, and having to urinate more often because of the uterus and baby pressing on your bladder.   PRENATAL EXAMS  · Blood work may continue to be done during prenatal exams. These tests are done to check on your health and the probable health of your baby. Blood work is used to follow your blood levels (hemoglobin). Anemia (low hemoglobin) is common during pregnancy. Iron and vitamins are given to help prevent this. You may also continue to be checked for diabetes. Some of the past blood tests may be done again.  · The size of the uterus is measured during each visit. This makes sure your baby is growing properly according to your pregnancy dates.  · Your blood pressure is checked every prenatal visit. This is to make sure you are not getting toxemia.  · Your urine is checked every prenatal visit for infection, diabetes and protein.  · Your weight is checked at each visit. This is done to make sure gains are happening at the suggested rate and that you and your baby are growing normally.  · Sometimes, an ultrasound is performed to confirm the position and the proper growth and development of the baby. This is a test done that bounces harmless sound waves off the baby so your caregiver can more accurately determine due dates.  · Discuss the type of pain medication and  anesthesia you will have during your labor and delivery.  · Discuss the possibility and anesthesia if a Cesarean Section might be necessary.  · Inform your caregiver if there is any mental or physical violence at home.  Sometimes, a specialized non-stress test, contraction stress test and biophysical profile are done to make sure the baby is not having a problem. Checking the amniotic fluid surrounding the baby is called an amniocentesis. The amniotic fluid is removed by sticking a needle into the belly (abdomen). This is sometimes done near the end of pregnancy if an early delivery is required. In this case, it is done to help make sure the baby's lungs are mature enough for the baby to live outside of the womb. If the lungs are not mature and it is unsafe to deliver the baby, an injection of cortisone medication is given to the mother 1 to 2 days before the delivery. This helps the baby's lungs mature and makes it safer to deliver the baby.  CHANGES OCCURING IN THE THIRD TRIMESTER OF PREGNANCY  Your body goes through many changes during pregnancy. They vary from person to person. Talk to your caregiver about changes you notice and are concerned about.  · During the last trimester, you have probably had an increase in your appetite. It is normal to have cravings for certain foods. This varies from person to person and pregnancy to pregnancy.  · You may begin to   get stretch marks on your hips, abdomen, and breasts. These are normal changes in the body during pregnancy. There are no exercises or medications to take which prevent this change.  · Constipation may be treated with a stool softener or adding bulk to your diet. Drinking lots of fluids, fiber in vegetables, fruits, and whole grains are helpful.  · Exercising is also helpful. If you have been very active up until your pregnancy, most of these activities can be continued during your pregnancy. If you have been less active, it is helpful to start an exercise  program such as walking. Consult your caregiver before starting exercise programs.  · Avoid all smoking, alcohol, un-prescribed drugs, herbs and "street drugs" during your pregnancy. These chemicals affect the formation and growth of the baby. Avoid chemicals throughout the pregnancy to ensure the delivery of a healthy infant.  · Backache, varicose veins and hemorrhoids may develop or get worse.  · You will tire more easily in the third trimester, which is normal.  · The baby's movements may be stronger and more often.  · You may become short of breath easily.  · Your belly button may stick out.  · A yellow discharge may leak from your breasts called colostrum.  · You may have a bloody mucus discharge. This usually occurs a few days to a week before labor begins.  HOME CARE INSTRUCTIONS   · Keep your caregiver's appointments. Follow your caregiver's instructions regarding medication use, exercise, and diet.  · During pregnancy, you are providing food for you and your baby. Continue to eat regular, well-balanced meals. Choose foods such as meat, fish, milk and other low fat dairy products, vegetables, fruits, and whole-grain breads and cereals. Your caregiver will tell you of the ideal weight gain.  · A physical sexual relationship may be continued throughout pregnancy if there are no other problems such as early (premature) leaking of amniotic fluid from the membranes, vaginal bleeding, or belly (abdominal) pain.  · Exercise regularly if there are no restrictions. Check with your caregiver if you are unsure of the safety of your exercises. Greater weight gain will occur in the last 2 trimesters of pregnancy. Exercising helps:  · Control your weight.  · Get you in shape for labor and delivery.  · You lose weight after you deliver.  · Rest a lot with legs elevated, or as needed for leg cramps or low back pain.  · Wear a good support or jogging bra for breast tenderness during pregnancy. This may help if worn during  sleep. Pads or tissues may be used in the bra if you are leaking colostrum.  · Do not use hot tubs, steam rooms, or saunas.  · Wear your seat belt when driving. This protects you and your baby if you are in an accident.  · Avoid raw meat, cat litter boxes and soil used by cats. These carry germs that can cause birth defects in the baby.  · It is easier to loose urine during pregnancy. Tightening up and strengthening the pelvic muscles will help with this problem. You can practice stopping your urination while you are going to the bathroom. These are the same muscles you need to strengthen. It is also the muscles you would use if you were trying to stop from passing gas. You can practice tightening these muscles up 10 times a set and repeating this about 3 times per day. Once you know what muscles to tighten up, do not perform these   exercises during urination. It is more likely to cause an infection by backing up the urine.  · Ask for help if you have financial, counseling or nutritional needs during pregnancy. Your caregiver will be able to offer counseling for these needs as well as refer you for other special needs.  · Make a list of emergency phone numbers and have them available.  · Plan on getting help from family or friends when you go home from the hospital.  · Make a trial run to the hospital.  · Take prenatal classes with the father to understand, practice and ask questions about the labor and delivery.  · Prepare the baby's room/nursery.  · Do not travel out of the city unless it is absolutely necessary and with the advice of your caregiver.  · Wear only low or no heal shoes to have better balance and prevent falling.  MEDICATIONS AND DRUG USE IN PREGNANCY  · Take prenatal vitamins as directed. The vitamin should contain 1 milligram of folic acid. Keep all vitamins out of reach of children. Only a couple vitamins or tablets containing iron may be fatal to a baby or young child when ingested.  · Avoid use  of all medications, including herbs, over-the-counter medications, not prescribed or suggested by your caregiver. Only take over-the-counter or prescription medicines for pain, discomfort, or fever as directed by your caregiver. Do not use aspirin, ibuprofen (Motrin®, Advil®, Nuprin®) or naproxen (Aleve®) unless OK'd by your caregiver.  · Let your caregiver also know about herbs you may be using.  · Alcohol is related to a number of birth defects. This includes fetal alcohol syndrome. All alcohol, in any form, should be avoided completely. Smoking will cause low birth rate and premature babies.  · Street/illegal drugs are very harmful to the baby. They are absolutely forbidden. A baby born to an addicted mother will be addicted at birth. The baby will go through the same withdrawal an adult does.  SEEK MEDICAL CARE IF:  You have any concerns or worries during your pregnancy. It is better to call with your questions if you feel they cannot wait, rather than worry about them.  DECISIONS ABOUT CIRCUMCISION  You may or may not know the sex of your baby. If you know your baby is a boy, it may be time to think about circumcision. Circumcision is the removal of the foreskin of the penis. This is the skin that covers the sensitive end of the penis. There is no proven medical need for this. Often this decision is made on what is popular at the time or based upon religious beliefs and social issues. You can discuss these issues with your caregiver or pediatrician.  SEEK IMMEDIATE MEDICAL CARE IF:   · An unexplained oral temperature above 102° F (38.9° C) develops, or as your caregiver suggests.  · You have leaking of fluid from the vagina (birth canal). If leaking membranes are suspected, take your temperature and tell your caregiver of this when you call.  · There is vaginal spotting, bleeding or passing clots. Tell your caregiver of the amount and how many pads are used.  · You develop a bad smelling vaginal discharge with  a change in the color from clear to white.  · You develop vomiting that lasts more than 24 hours.  · You develop chills or fever.  · You develop shortness of breath.  · You develop burning on urination.  · You loose more than 2 pounds of weight   or gain more than 2 pounds of weight or as suggested by your caregiver.  · You notice sudden swelling of your face, hands, and feet or legs.  · You develop belly (abdominal) pain. Round ligament discomfort is a common non-cancerous (benign) cause of abdominal pain in pregnancy. Your caregiver still must evaluate you.  · You develop a severe headache that does not go away.  · You develop visual problems, blurred or double vision.  · If you have not felt your baby move for more than 1 hour. If you think the baby is not moving as much as usual, eat something with sugar in it and lie down on your left side for an hour. The baby should move at least 4 to 5 times per hour. Call right away if your baby moves less than that.  · You fall, are in a car accident or any kind of trauma.  · There is mental or physical violence at home.  Document Released: 08/27/2001 Document Revised: 11/25/2011 Document Reviewed: 03/01/2009  [employer]® Patient Information ©2013 [employer].

## 2012-12-15 ENCOUNTER — Inpatient Hospital Stay (HOSPITAL_COMMUNITY)
Admission: AD | Admit: 2012-12-15 | Discharge: 2012-12-17 | DRG: 373 | Disposition: A | Discharge status: Home / Self Care | Payer: BC Managed Care – PPO | Source: Ambulatory Visit | Attending: Obstetrics & Gynecology | Admitting: Obstetrics & Gynecology

## 2012-12-15 ENCOUNTER — Encounter (HOSPITAL_COMMUNITY): Payer: Self-pay | Admitting: *Deleted

## 2012-12-15 DIAGNOSIS — O2613 Low weight gain in pregnancy, third trimester: Secondary | ICD-10-CM

## 2012-12-15 DIAGNOSIS — O99892 Other specified diseases and conditions complicating childbirth: Secondary | ICD-10-CM | POA: Diagnosis present

## 2012-12-15 DIAGNOSIS — Z22330 Carrier of Group B streptococcus: Secondary | ICD-10-CM

## 2012-12-15 LAB — CBC
HCT: 30.1 % — ABNORMAL LOW (ref 36.0–46.0)
Hemoglobin: 9.5 g/dL — ABNORMAL LOW (ref 12.0–15.0)
MCV: 71.2 fL — ABNORMAL LOW (ref 78.0–100.0)
RBC: 4.23 MIL/uL (ref 3.87–5.11)
WBC: 9.9 10*3/uL (ref 4.0–10.5)

## 2012-12-15 LAB — ABO/RH: ABO/RH(D): A POS

## 2012-12-15 LAB — RPR: RPR Ser Ql: NONREACTIVE

## 2012-12-15 LAB — TYPE AND SCREEN

## 2012-12-15 MED ORDER — FERROUS SULFATE 325 (65 FE) MG PO TABS
325.0000 mg | ORAL_TABLET | Freq: Two times a day (BID) | ORAL | Status: DC
  Administered 2012-12-15 – 2012-12-17 (×4): 325 mg via ORAL
  Filled 2012-12-15 (×4): qty 1

## 2012-12-15 MED ORDER — MEASLES, MUMPS & RUBELLA VAC ~~LOC~~ INJ
0.5000 mL | INJECTION | Freq: Once | SUBCUTANEOUS | Status: DC
  Filled 2012-12-15: qty 0.5

## 2012-12-15 MED ORDER — SENNOSIDES-DOCUSATE SODIUM 8.6-50 MG PO TABS
2.0000 | ORAL_TABLET | Freq: Every day | ORAL | Status: DC
  Administered 2012-12-15 – 2012-12-16 (×2): 2 via ORAL

## 2012-12-15 MED ORDER — DIBUCAINE 1 % RE OINT: 1.0000 "application " | TOPICAL_OINTMENT | RECTAL | Status: DC | PRN

## 2012-12-15 MED ORDER — LACTATED RINGERS IV SOLN
INTRAVENOUS | Status: DC
  Administered 2012-12-15: 09:00:00 via INTRAVENOUS

## 2012-12-15 MED ORDER — SODIUM CHLORIDE 0.9 % IV SOLN
2.0000 g | Freq: Four times a day (QID) | INTRAVENOUS | Status: DC
  Administered 2012-12-15: 2 g via INTRAVENOUS
  Filled 2012-12-15 (×3): qty 2000

## 2012-12-15 MED ORDER — FLEET ENEMA 7-19 GM/118ML RE ENEM: 1.0000 | ENEMA | Freq: Every day | RECTAL | Status: DC | PRN

## 2012-12-15 MED ORDER — CITRIC ACID-SODIUM CITRATE 334-500 MG/5ML PO SOLN: 30.0000 mL | ORAL | Status: DC | PRN

## 2012-12-15 MED ORDER — BENZOCAINE-MENTHOL 20-0.5 % EX AERO: 1.0000 "application " | INHALATION_SPRAY | CUTANEOUS | Status: DC | PRN

## 2012-12-15 MED ORDER — ONDANSETRON HCL 4 MG/2ML IJ SOLN: 4.0000 mg | INTRAMUSCULAR | Status: DC | PRN

## 2012-12-15 MED ORDER — BISACODYL 10 MG RE SUPP: 10.0000 mg | Freq: Every day | RECTAL | Status: DC | PRN

## 2012-12-15 MED ORDER — NALBUPHINE SYRINGE 5 MG/0.5 ML
10.0000 mg | INJECTION | INTRAMUSCULAR | Status: DC | PRN
  Administered 2012-12-15: 10 mg via INTRAVENOUS
  Filled 2012-12-15 (×2): qty 1

## 2012-12-15 MED ORDER — ACETAMINOPHEN 325 MG PO TABS: 650.0000 mg | ORAL_TABLET | ORAL | Status: DC | PRN

## 2012-12-15 MED ORDER — SIMETHICONE 80 MG PO CHEW: 80.0000 mg | CHEWABLE_TABLET | ORAL | Status: DC | PRN

## 2012-12-15 MED ORDER — FLEET ENEMA 7-19 GM/118ML RE ENEM: 1.0000 | ENEMA | RECTAL | Status: DC | PRN

## 2012-12-15 MED ORDER — PRENATAL MULTIVITAMIN CH
1.0000 | ORAL_TABLET | Freq: Every day | ORAL | Status: DC
  Administered 2012-12-16 – 2012-12-17 (×2): 1 via ORAL
  Filled 2012-12-15 (×2): qty 1

## 2012-12-15 MED ORDER — OXYTOCIN 40 UNITS IN LACTATED RINGERS INFUSION - SIMPLE MED: 62.5000 mL/h | INTRAVENOUS | Status: DC | PRN

## 2012-12-15 MED ORDER — LANOLIN HYDROUS EX OINT: TOPICAL_OINTMENT | CUTANEOUS | Status: DC | PRN

## 2012-12-15 MED ORDER — DIPHENHYDRAMINE HCL 25 MG PO CAPS: 25.0000 mg | ORAL_CAPSULE | Freq: Four times a day (QID) | ORAL | Status: DC | PRN

## 2012-12-15 MED ORDER — ZOLPIDEM TARTRATE 5 MG PO TABS: 5.0000 mg | ORAL_TABLET | Freq: Every evening | ORAL | Status: DC | PRN

## 2012-12-15 MED ORDER — LACTATED RINGERS IV SOLN: 500.0000 mL | INTRAVENOUS | Status: DC | PRN

## 2012-12-15 MED ORDER — OXYCODONE-ACETAMINOPHEN 5-325 MG PO TABS
1.0000 | ORAL_TABLET | ORAL | Status: DC | PRN
  Filled 2012-12-15: qty 1

## 2012-12-15 MED ORDER — OXYTOCIN BOLUS FROM INFUSION
500.0000 mL | INTRAVENOUS | Status: DC
  Administered 2012-12-15: 500 mL via INTRAVENOUS

## 2012-12-15 MED ORDER — ONDANSETRON HCL 4 MG/2ML IJ SOLN: 4.0000 mg | Freq: Four times a day (QID) | INTRAMUSCULAR | Status: DC | PRN

## 2012-12-15 MED ORDER — OXYCODONE-ACETAMINOPHEN 5-325 MG PO TABS
1.0000 | ORAL_TABLET | ORAL | Status: DC | PRN
  Administered 2012-12-16: 1 via ORAL

## 2012-12-15 MED ORDER — ONDANSETRON HCL 4 MG PO TABS: 4.0000 mg | ORAL_TABLET | ORAL | Status: DC | PRN

## 2012-12-15 MED ORDER — WITCH HAZEL-GLYCERIN EX PADS: 1.0000 "application " | MEDICATED_PAD | CUTANEOUS | Status: DC | PRN

## 2012-12-15 MED ORDER — OXYTOCIN 40 UNITS IN LACTATED RINGERS INFUSION - SIMPLE MED
62.5000 mL/h | INTRAVENOUS | Status: DC
  Administered 2012-12-15: 62.5 mL/h via INTRAVENOUS
  Filled 2012-12-15: qty 1000

## 2012-12-15 MED ORDER — IBUPROFEN 600 MG PO TABS
600.0000 mg | ORAL_TABLET | Freq: Four times a day (QID) | ORAL | Status: DC
  Administered 2012-12-15 – 2012-12-17 (×8): 600 mg via ORAL
  Filled 2012-12-15: qty 1

## 2012-12-15 MED ORDER — TETANUS-DIPHTH-ACELL PERTUSSIS 5-2.5-18.5 LF-MCG/0.5 IM SUSP
0.5000 mL | Freq: Once | INTRAMUSCULAR | Status: AC
  Administered 2012-12-16: 0.5 mL via INTRAMUSCULAR
  Filled 2012-12-15: qty 0.5

## 2012-12-15 MED ORDER — LIDOCAINE HCL (PF) 1 % IJ SOLN
30.0000 mL | INTRAMUSCULAR | Status: AC | PRN
  Administered 2012-12-15: 30 mL via SUBCUTANEOUS
  Filled 2012-12-15 (×2): qty 30

## 2012-12-15 MED ORDER — IBUPROFEN 600 MG PO TABS
600.0000 mg | ORAL_TABLET | Freq: Four times a day (QID) | ORAL | Status: DC | PRN
  Filled 2012-12-15 (×7): qty 1

## 2012-12-15 NOTE — H&P (Signed)
Sarah Day is a 22 y.o. female G2P2002 at [redacted]w[redacted]d presenting for contractions.  Pt does not speak [place]. No interpreter present and no interpreter available to speak her language ([place]) via language line so husband was used to provide history as he speaks some [place].  PNC was obtained here in [facility] hospital clinic [facility]. Pregnancy complicated by late to [facility] (28+5), GBS positive, and poor weight gain.  History OB History   Grav Para Term Preterm Abortions TAB SAB Ect Mult Living   2 1 1  0 0 0 0 0 0 1     Husband denies that pt has any medical problems or has had any surgeries. No prior hospitalizations other than birth of previous child in March 2012 at [facility] in [place]. No complications of prior pregnancy or delivery.  Family History: family history is not on file. Social History: Husband denies that patient smokes cigarettes, does any drugs, or uses alcohol.   Prenatal Transfer Tool  Maternal Diabetes: No Genetic Screening: Declined (late to care) Maternal Ultrasounds/Referrals: Normal Fetal Ultrasounds or other Referrals:  None Maternal Substance Abuse:  No Significant Maternal Medications:  None Significant Maternal Lab Results:  Lab values include: Group B Strep positive Other Comments:  late to [facility] (28+5), poor weight gain during pregnancy  ROS   As per HPI  Dilation: 7 Effacement (%): 80 Station: -1 Exam by:: Dorrene German RN Blood pressure 132/82, pulse 79, temperature 98.1 F (36.7 C), temperature source Oral, resp. rate 20, last menstrual period 03/13/2012. Exam Physical Exam  Gen: NAD, cooperative Heart: RRR Lungs: CTAB, NWOB Abd: gravid, nontender to palpation Ext: brisk capillary refill, calves nontender to palpation Neuro: nonfocal, speech intact  Prenatal labs: ABO, Rh: A/POS/-- (01/15 0945) Antibody: NEG (01/15 0945) Rubella: 1.89 (01/15 0945) RPR: NON REAC (01/15 0945)  HBsAg: NEGATIVE (01/15 0945)  HIV: NON REACTIVE (01/15 0945)  GBS:    POSITIVE  Assessment/Plan: Active labor, cervix 7cm at 39+4 Admit to L&D for expectant management of labor, anticipate SVD. Ampicillin for GBS +  Declines epidural at this time  Levert Feinstein 12/15/2012, 9:09 AM  I saw and examined patient and agree with above resident note and plan. I reviewed history, imaging, labs, and vitals. I personally reviewed the fetal heart tracing, and it is reactive. Note - recheck of cervix by RN on labor and delivery: 4-5/50/-2.   Napoleon Form, MD

## 2012-12-15 NOTE — MAU Note (Signed)
uc's since 0600, denies bleeding or LOF.

## 2012-12-15 NOTE — Progress Notes (Signed)
[employer] contacted for interpreter of Dari language. Per contact person for [employer] no Dari translators available.

## 2012-12-16 ENCOUNTER — Encounter: Payer: BC Managed Care – PPO | Admitting: Advanced Practice Midwife

## 2012-12-16 NOTE — Progress Notes (Signed)
I have seen and examined this patient and I agree with the above. Cam Hai 8:10 AM 12/16/2012

## 2012-12-16 NOTE — Progress Notes (Signed)
Attempted to call Dari interpreter for pt, but no one available. Family member interpreted PKU information for family.

## 2012-12-16 NOTE — Progress Notes (Signed)
Post Partum Day 1 Interviewed using husband as interpreter (were not able to get interpreter yesterday)  Subjective: up ad lib, voiding, tolerating PO, + flatus and complains of some pain in RLQ  Objective: Blood pressure 116/75, pulse 74, temperature 97.6 F (36.4 C), temperature source Oral, resp. rate 18, height 5\' 1"  (1.549 m), weight 150 lb (68.04 kg), last menstrual period 03/13/2012, unknown if currently breastfeeding.  Physical Exam:  General: alert, cooperative and no distress Lochia: appropriate Uterine Fundus: firm, mildly tender to palpation especially on R side Incision: n/a DVT Evaluation: No evidence of DVT seen on physical exam. Negative Homan's sign. No cords or calf tenderness.   Recent Labs  12/15/12 1000  HGB 9.5*  HCT 30.1*    Assessment/Plan: Plan for discharge tomorrow Breastfeeding and bottle feeding Plans to use depo shot for birth control   LOS: 1 day   Levert Feinstein 12/16/2012, 7:30 AM

## 2012-12-17 MED ORDER — IBUPROFEN 600 MG PO TABS: 600.0000 mg | ORAL_TABLET | Freq: Four times a day (QID) | ORAL | Status: DC | PRN

## 2012-12-17 MED ORDER — PRENATAL MULTIVITAMIN CH: 1.0000 | ORAL_TABLET | Freq: Every day | ORAL | Status: DC

## 2012-12-17 MED ORDER — DOCUSATE SODIUM 100 MG PO CAPS: 100.0000 mg | ORAL_CAPSULE | Freq: Two times a day (BID) | ORAL | Status: DC | PRN

## 2012-12-17 MED ORDER — FERROUS SULFATE 325 (65 FE) MG PO TABS: 325.0000 mg | ORAL_TABLET | Freq: Two times a day (BID) | ORAL | Status: DC

## 2012-12-17 NOTE — Discharge Summary (Signed)
Obstetric Discharge Summary Reason for Admission: onset of labor Prenatal Procedures: none Intrapartum Procedures: spontaneous vaginal delivery at [redacted]w[redacted]d Postpartum Procedures: none Complications-Operative and Postpartum: second degree perineal laceration  Hemoglobin  Date Value Range Status  12/15/2012 9.5* 12.0 - 15.0 g/dL Final     HCT  Date Value Range Status  12/15/2012 30.1* 36.0 - 46.0 % Final    Physical Exam:  General: alert, cooperative and no distress Lochia: appropriate Uterine Fundus: firm Incision: n/a DVT Evaluation: No evidence of DVT seen on physical exam. Negative Homan's sign. No cords or calf tenderness.  Discharge Diagnoses: Term Pregnancy-delivered  Discharge Information: Date: 12/17/2012 Activity: pelvic rest Diet: routine Medications: PNV, Ibuprofen, Colace and Iron Condition: stable Instructions: refer to practice specific booklet Discharge to: home   Newborn Data: Live born female  Birth Weight: 7 lb 8.6 oz (3419 g) APGAR: 8, 9  Home with mother.  Levert Feinstein 12/17/2012, 7:36 AM  I saw and examined patient along with student and agree with above note.   Barnett Elzey 12/20/2012 9:44 AM

## 2012-12-20 NOTE — Discharge Summary (Signed)

## 2013-02-19 ENCOUNTER — Encounter: Payer: Self-pay | Admitting: Obstetrics and Gynecology

## 2013-02-19 ENCOUNTER — Ambulatory Visit (INDEPENDENT_AMBULATORY_CARE_PROVIDER_SITE_OTHER): Payer: Self-pay | Admitting: Obstetrics and Gynecology

## 2013-02-19 NOTE — Progress Notes (Addendum)
Patient ID: Sarah Day, female   DOB: May 29, 1991, 22 y.o.   MRN: [id number] Subjective:     Sarah Day is a 22 y.o. female who presents for a postpartum visit. She is 9 weeks postpartum following a spontaneous vaginal delivery. I have fully reviewed the prenatal and intrapartum course. The delivery was at [redacted]w[redacted]d gestational age. Outcome: spontaneous vaginal delivery. Postpartum course has been uncomplicated. Baby's course has been uncomplicated; patient reports no complaints and that baby has received well-check. Baby is feeding by both breast and bottle. Bleeding no bleeding. Denies having menstrual period. Patient is not sexually active. Contraception method is condoms. Postpartum depression screening: negative. Reports having resumed daily activities, exercise, and diet.  Review of Systems Genitourinary:negative for vaginal discharge . Denies abdominal and pelvic pain.   Objective:    BP 115/78  Pulse 80  Temp(Src) 97 F (36.1 C) (Oral)  General:  alert, cooperative and no distress     Lungs: clear to auscultation bilaterally  Heart:  regular rate and rhythm, S1, S2 normal, no murmur, click, rub or gallop  Abdomen: soft, non-tender; bowel sounds normal; no masses,  no organomegaly   Vulva:  normal  Vagina: normal vagina, no discharge, exudate, lesion, or erythema. Second degree laceration fully healed.  Cervix:  no lesions, closed  Corpus: normal size, contour, position, consistency, mobility, non-tender  Adnexa:  normal adnexa  Rectal Exam: Not performed.        Assessment:   Postpartum 9.5 weeks doing well  Plan:    1. Contraception: condoms 2. Discussed contraception 3. Follow up in: January 2015 for PAP smear.  Evaluation and management procedures were performed by PA-S under my supervision/collaboration. Chart reviewed, patient examined by me and I agree with management and plan.  Danae Orleans, CNM 02/19/2013 10:39 AM

## 2013-02-19 NOTE — Patient Instructions (Signed)

## 2013-08-30 ENCOUNTER — Encounter: Payer: Self-pay | Admitting: Family Medicine

## 2013-08-30 ENCOUNTER — Ambulatory Visit (INDEPENDENT_AMBULATORY_CARE_PROVIDER_SITE_OTHER): Payer: BC Managed Care – PPO | Admitting: Family Medicine

## 2013-08-30 VITALS — BP 122/83 | HR 85 | Temp 97.9°F | Ht 62.5 in | Wt 135.0 lb

## 2013-08-30 DIAGNOSIS — Z0000 Encounter for general adult medical examination without abnormal findings: Secondary | ICD-10-CM

## 2013-08-30 DIAGNOSIS — E669 Obesity, unspecified: Secondary | ICD-10-CM

## 2013-08-30 DIAGNOSIS — D649 Anemia, unspecified: Secondary | ICD-10-CM

## 2013-08-30 DIAGNOSIS — Z23 Encounter for immunization: Secondary | ICD-10-CM

## 2013-08-30 LAB — CBC
HCT: 35 % — ABNORMAL LOW (ref 36.0–46.0)
Hemoglobin: 11.1 g/dL — ABNORMAL LOW (ref 12.0–15.0)
RBC: 5.1 MIL/uL (ref 3.87–5.11)
RDW: 16 % — ABNORMAL HIGH (ref 11.5–15.5)
WBC: 6.8 10*3/uL (ref 4.0–10.5)

## 2013-08-30 LAB — COMPREHENSIVE METABOLIC PANEL
Albumin: 4.6 g/dL (ref 3.5–5.2)
Alkaline Phosphatase: 86 U/L (ref 39–117)
BUN: 9 mg/dL (ref 6–23)
CO2: 27 mEq/L (ref 19–32)
Creat: 0.49 mg/dL — ABNORMAL LOW (ref 0.50–1.10)
Glucose, Bld: 90 mg/dL (ref 70–99)
Potassium: 3.9 mEq/L (ref 3.5–5.3)
Sodium: 138 mEq/L (ref 135–145)
Total Protein: 7.4 g/dL (ref 6.0–8.3)

## 2013-08-30 LAB — LIPID PANEL: LDL Cholesterol: 69 mg/dL (ref 0–99)

## 2013-08-30 NOTE — Assessment & Plan Note (Signed)
PAP smear up to date Flu shot today Lipid panel and CMP today

## 2013-08-30 NOTE — Progress Notes (Signed)
Patient ID: Sarah Day, female   DOB: February 22, 1991, 22 y.o.   MRN: [id number]  Chief Complaint  Patient presents with  . New patient    wants flu shot/concerned about vaccinations    HPI Sarah Day is a 22 y.o. female presents to establish care. She denies any concerns. She would like a flu shot and a general check up. Since the delivery of her baby 8 months ago, she is doing well with regular periods.   History reviewed. No pertinent past medical history.  History reviewed. No pertinent past surgical history.  History reviewed. No pertinent family history.  Social History History  Substance Use Topics  . Smoking status: Never Smoker   . Smokeless tobacco: Never Used  . Alcohol Use: No   She is from [place] and came to the [place] in 2011. She speaks [place]. She works at a [employer].   No Known Allergies  Review of Systems Review of Systems  Constitutional: Negative for fever, appetite change and fatigue.  HENT: Negative for rhinorrhea and sinus pressure.   Respiratory: Negative for cough and shortness of breath.   Cardiovascular: Negative for chest pain and leg swelling.  Gastrointestinal: Negative for abdominal pain, constipation and blood in stool.  Genitourinary: Negative for dysuria and frequency.  Skin: Negative for rash.  Neurological: Negative for headaches.  Psychiatric/Behavioral: Negative for behavioral problems.    Blood pressure 122/83, pulse 85, temperature 97.9 F (36.6 C), temperature source Oral, height 5' 2.5" (1.588 m), weight 135 lb (61.236 kg), last menstrual period 08/14/2013, not currently breastfeeding.  Physical Exam Physical Exam General: no acute distress, well appearing HEENT: moist mucous membranes, oropharynx clear, normal nasal turbinates, TM normal bilaterally Neck: supple, no cervical adenopathy or enlarged thyroid CV: S1S2, RRR, no murmur appreciated Pulm: clear to auscultation bilaterally, no crackles or wheezing Abdomen:  soft, non tender, non distended, bowel sounds present Ext: no pedal edema  Data Reviewed none  Assessment    22 yo female who presents to establish care. No current concerns. Doing well.     Plan    - will obtain lipid panel and CMP as screening for hyperlipidemia - will also obtain CBC since she was anemic after her pregnancy. Would like to ensure she is back to normal. If not, will supplement with iron.  - PAP smear is up to date - flu shot given today - follow up in 6-12 months or sooner if needed     Stepanie Graver 08/30/2013, 9:42 AM

## 2013-08-30 NOTE — Patient Instructions (Signed)
I will call you if anything from the labwork comes back abnormal.  It was great meeting you!

## 2013-11-03 ENCOUNTER — Encounter: Payer: Self-pay | Admitting: Family Medicine

## 2014-01-04 ENCOUNTER — Encounter: Payer: Self-pay | Admitting: Family Medicine

## 2014-01-28 ENCOUNTER — Encounter: Payer: Self-pay | Admitting: Nurse Practitioner

## 2014-01-28 ENCOUNTER — Ambulatory Visit (INDEPENDENT_AMBULATORY_CARE_PROVIDER_SITE_OTHER): Payer: BC Managed Care – PPO | Admitting: Nurse Practitioner

## 2014-01-28 VITALS — BP 114/74 | HR 71 | Temp 97.1°F | Ht 61.0 in | Wt 134.2 lb

## 2014-01-28 DIAGNOSIS — Z01812 Encounter for preprocedural laboratory examination: Secondary | ICD-10-CM

## 2014-01-28 DIAGNOSIS — Z0000 Encounter for general adult medical examination without abnormal findings: Secondary | ICD-10-CM

## 2014-01-28 DIAGNOSIS — Z3049 Encounter for surveillance of other contraceptives: Secondary | ICD-10-CM

## 2014-01-28 DIAGNOSIS — Z309 Encounter for contraceptive management, unspecified: Secondary | ICD-10-CM | POA: Insufficient documentation

## 2014-01-28 DIAGNOSIS — Z01419 Encounter for gynecological examination (general) (routine) without abnormal findings: Secondary | ICD-10-CM | POA: Insufficient documentation

## 2014-01-28 LAB — POCT PREGNANCY, URINE: Preg Test, Ur: NEGATIVE

## 2014-01-28 MED ORDER — MEDROXYPROGESTERONE ACETATE 104 MG/0.65ML ~~LOC~~ SUSP
104.0000 mg | Freq: Once | SUBCUTANEOUS | Status: AC
  Administered 2014-01-28: 104 mg via SUBCUTANEOUS

## 2014-01-28 NOTE — Progress Notes (Signed)
History:  Sarah Day is a 23 y.o. G2P2002 who [facility] and change to depo provera. She does not speak [place] and has an interpreter. She has been using condoms 100% of time. She has not had any unprotected intercourse in last 2 weeks. She has no partner change. She denies any vaginal issues.   The following portions of the patient's history were reviewed and updated as appropriate: allergies, current medications, past family history, past medical history, past social history, past surgical history and problem list.  Review of Systems:  Pertinent items are noted in HPI.  Objective:  Physical Exam BP 114/74  Pulse 71  Temp(Src) 97.1 F (36.2 C) (Oral)  Ht 5\' 1"  (1.549 m)  Wt 134 lb 3.2 oz (60.873 kg)  BMI 25.37 kg/m2  LMP 01/19/2014 GENERAL: Well-developed, well-nourished female in no acute distress.  HEENT: Normocephalic, atraumatic.  NECK: Supple. Normal thyroid.  LUNGS: Normal rate. Clear to auscultation bilaterally.  HEART: Regular rate and rhythm with no adventitious sounds.  BREASTS: Symmetric in size. No masses, skin changes, nipple drainage, or lymphadenopathy. ABDOMEN: Soft, nontender, nondistended. No organomegaly. Normal bowel sounds appreciated in all quadrants.  PELVIC: Normal external female genitalia. Vagina is pink and rugated.  Normal discharge. Normal cervix contour. Pap smear obtained. Uterus is normal in size. No adnexal mass or tenderness.  EXTREMITIES: No cyanosis, clubbing, or edema, 2+ distal pulses.   Labs and Imaging No results found.  Assessment & Plan:  Assessment:  Well Woman exam Contraception Management  Plans: Will be notified of pap results Depoprovera 104 sq every 13 weeks for one year   Delbert PhenixLinda M Kensli Bowley, NP 01/28/2014 1:14 PM

## 2014-01-28 NOTE — Patient Instructions (Signed)

## 2014-01-28 NOTE — Progress Notes (Signed)
Pt. Here today for annual exam. Last pap 09/30/12 and was normal. No pap needed today. Would like to begin depo injection. UPT obtained.

## 2014-04-21 ENCOUNTER — Ambulatory Visit (INDEPENDENT_AMBULATORY_CARE_PROVIDER_SITE_OTHER): Payer: BC Managed Care – PPO | Admitting: *Deleted

## 2014-04-21 VITALS — BP 120/84 | HR 82 | Temp 98.7°F | Wt 140.7 lb

## 2014-04-21 DIAGNOSIS — N898 Other specified noninflammatory disorders of vagina: Secondary | ICD-10-CM

## 2014-04-21 DIAGNOSIS — Z30013 Encounter for initial prescription of injectable contraceptive: Secondary | ICD-10-CM

## 2014-04-21 DIAGNOSIS — Z3049 Encounter for surveillance of other contraceptives: Secondary | ICD-10-CM | POA: Diagnosis not present

## 2014-04-21 DIAGNOSIS — L293 Anogenital pruritus, unspecified: Secondary | ICD-10-CM

## 2014-04-21 MED ORDER — FLUCONAZOLE 150 MG PO TABS: 150.0000 mg | ORAL_TABLET | Freq: Once | ORAL | Status: DC

## 2014-04-21 MED ORDER — MEDROXYPROGESTERONE ACETATE 104 MG/0.65ML ~~LOC~~ SUSP
104.0000 mg | Freq: Once | SUBCUTANEOUS | Status: AC
  Administered 2014-04-21: 104 mg via SUBCUTANEOUS

## 2014-04-21 NOTE — Progress Notes (Signed)
Encounter for Depo provera 104mg ,  Pt complains of itching in vaginal area, prescription for Diflucan ordered.  Pharmacy confirmed

## 2014-06-22 ENCOUNTER — Ambulatory Visit (INDEPENDENT_AMBULATORY_CARE_PROVIDER_SITE_OTHER): Payer: BC Managed Care – PPO | Admitting: *Deleted

## 2014-06-22 DIAGNOSIS — Z23 Encounter for immunization: Secondary | ICD-10-CM

## 2014-07-15 ENCOUNTER — Ambulatory Visit: Payer: BC Managed Care – PPO

## 2014-07-18 ENCOUNTER — Encounter: Payer: Self-pay | Admitting: Nurse Practitioner

## 2014-08-26 ENCOUNTER — Encounter: Payer: Self-pay | Admitting: Medical

## 2014-08-26 ENCOUNTER — Ambulatory Visit (INDEPENDENT_AMBULATORY_CARE_PROVIDER_SITE_OTHER): Payer: BC Managed Care – PPO | Admitting: Medical

## 2014-08-26 VITALS — BP 120/77 | HR 76 | Ht 61.0 in | Wt 142.3 lb

## 2014-08-26 DIAGNOSIS — N898 Other specified noninflammatory disorders of vagina: Secondary | ICD-10-CM | POA: Diagnosis not present

## 2014-08-26 DIAGNOSIS — Z3042 Encounter for surveillance of injectable contraceptive: Secondary | ICD-10-CM | POA: Diagnosis not present

## 2014-08-26 LAB — POCT PREGNANCY, URINE: Preg Test, Ur: NEGATIVE

## 2014-08-26 MED ORDER — FLUCONAZOLE 150 MG PO TABS: 150.0000 mg | ORAL_TABLET | Freq: Once | ORAL | Status: DC

## 2014-08-26 MED ORDER — MEDROXYPROGESTERONE ACETATE 104 MG/0.65ML ~~LOC~~ SUSP
104.0000 mg | Freq: Once | SUBCUTANEOUS | Status: AC
  Administered 2014-08-26: 104 mg via SUBCUTANEOUS

## 2014-08-26 NOTE — Progress Notes (Signed)
Patient ID: Sarah Day, female   DOB: Aug 05, 1991, 23 y.o.   MRN: [id number]  History:  Ms. Sarah Day  is a 23 y.o. [phone] [id number] who presents to clinic today for vaginal itching, pelvic pain and white discharge x 2 weeks. Patient has had Depo Provera in the past and is a few weeks late for her dose. She states condom use regularly. Patient denies vaginal bleeding, fever.   The following portions of the patient's history were reviewed and updated as appropriate: allergies, current medications, past family history, past medical history, past social history, past surgical history and problem list.  Review of Systems:  Pertinent items are noted in HPI.  Objective:  Physical Exam BP 120/77 mmHg  Pulse 76  Ht 5\' 1"  (1.549 m)  Wt 142 lb 4.8 oz (64.547 kg)  BMI 26.90 kg/m2 GENERAL: Well-developed, well-nourished female in no acute distress.  HEENT: Normocephalic, atraumatic.  LUNGS: Normal effort HEART: Regular rate  ABDOMEN: Soft, nontender, nondistended. No organomegaly. Normal bowel sounds appreciated in all quadrants.  PELVIC: Normal external female genitalia. Vagina is pink and rugated.  Moderate amount of thick, white discharge. Normal cervix contour. Uterus is normal in size. No adnexal mass or tenderness.  EXTREMITIES: No cyanosis, clubbing, or edema  Labs and Imaging UPT negative Assessment & Plan:  Assessment: Yeast vulvovaginitis Contraceptive management  Plans: Depo provera today Rx for Diflucan sent to patient's pharmacy Patient advised she will be called with any other abnormal results of wet prep Patient to return in 12 weeks for Depo provera or sooner PRN  Marny LowensteinJulie N Fadia Marlar, PA-C 08/26/2014 9:27 AM

## 2014-08-26 NOTE — Addendum Note (Signed)
Addended by: Marny LowensteinWENZEL, Mariselda Badalamenti N on: 08/26/2014 10:21 AM   Modules accepted: Orders

## 2014-08-26 NOTE — Progress Notes (Signed)
Pt reports intermittent vaginal itching, wants to make sure everything is okay.Pt would also like to get her depo today, she is late for injection but states that she has used condoms for protection since she missed the last shot.

## 2014-08-26 NOTE — Patient Instructions (Signed)

## 2014-08-27 LAB — WET PREP, GENITAL
CLUE CELLS WET PREP: NONE SEEN
TRICH WET PREP: NONE SEEN
Yeast Wet Prep HPF POC: NONE SEEN

## 2014-08-31 ENCOUNTER — Ambulatory Visit: Payer: Self-pay | Admitting: Obstetrics & Gynecology

## 2014-09-09 IMAGING — US US OB DETAIL+14 WK
1 series · 12 of 28 positions shown · non-contrast
Comparison: none

[Series 1: us ob detail +14 wk · 12 of 91 slices shown]
[im 4/91]
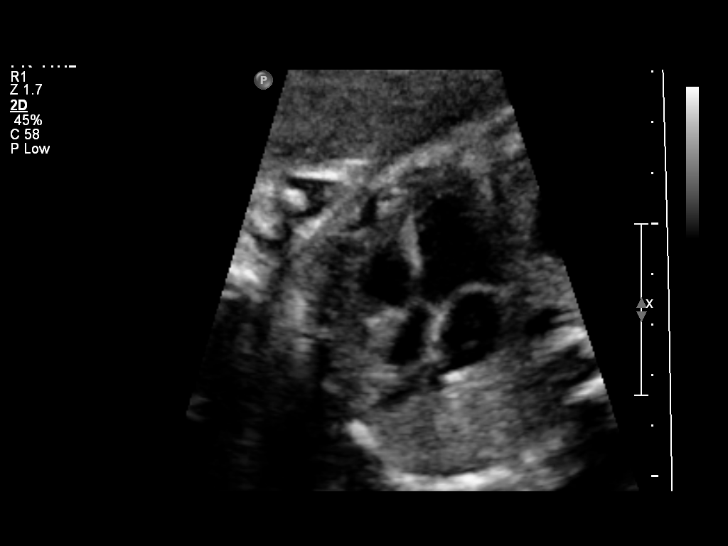
[im 11/91]
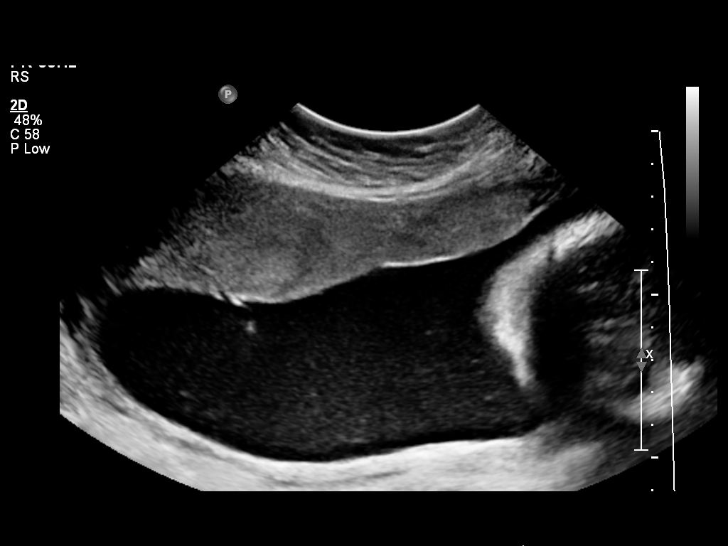
[im 17/91]
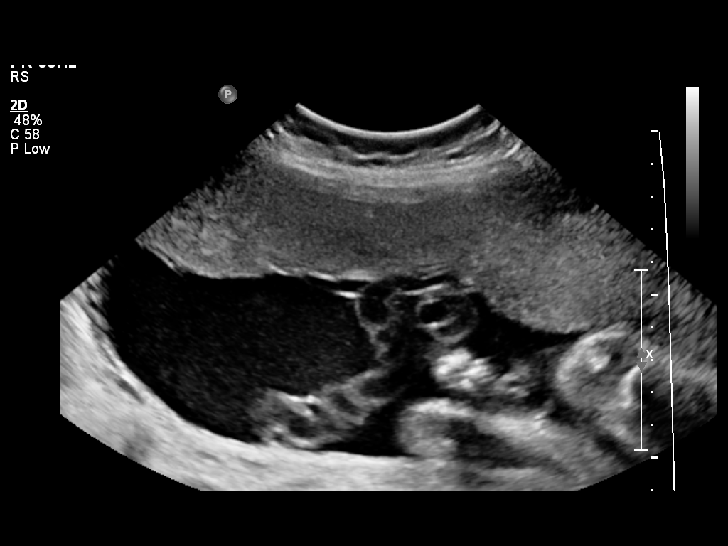
[im 27/91]
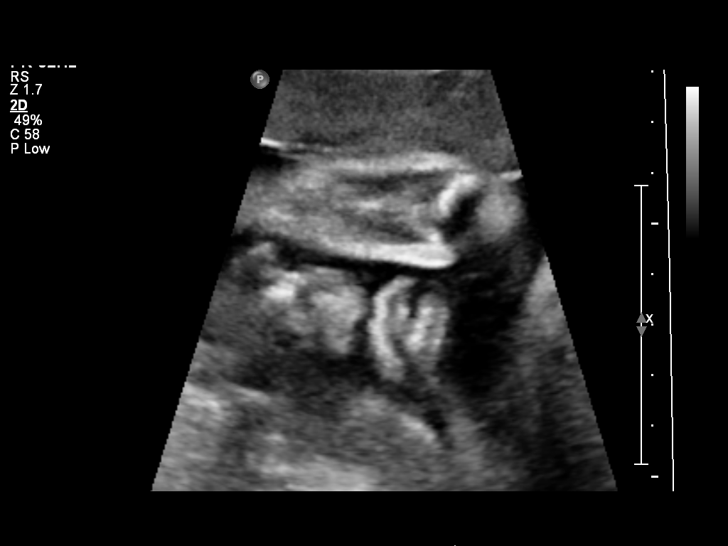
[im 34/91]
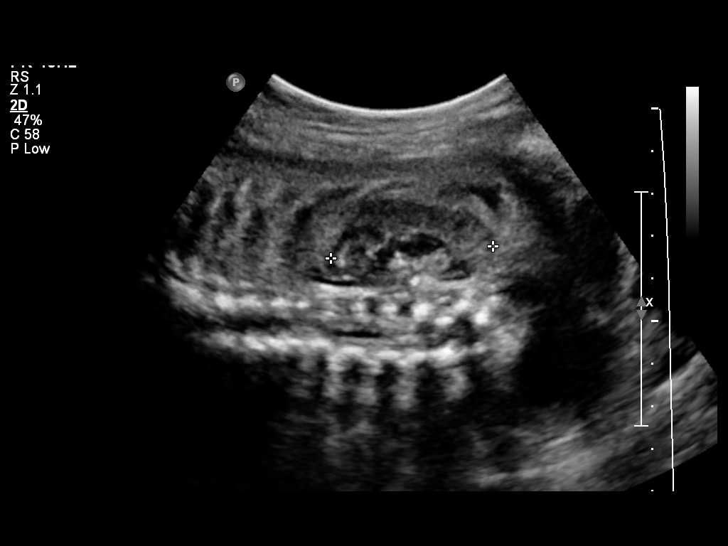
[im 41/91]
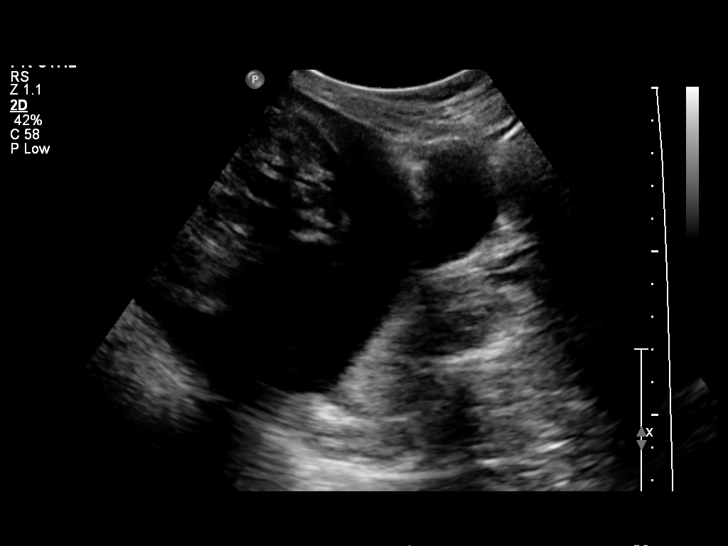
[im 51/91]
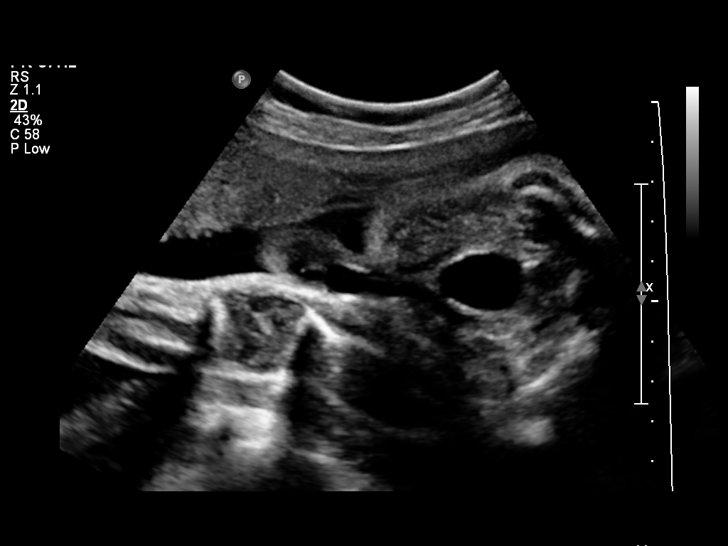
[im 57/91]
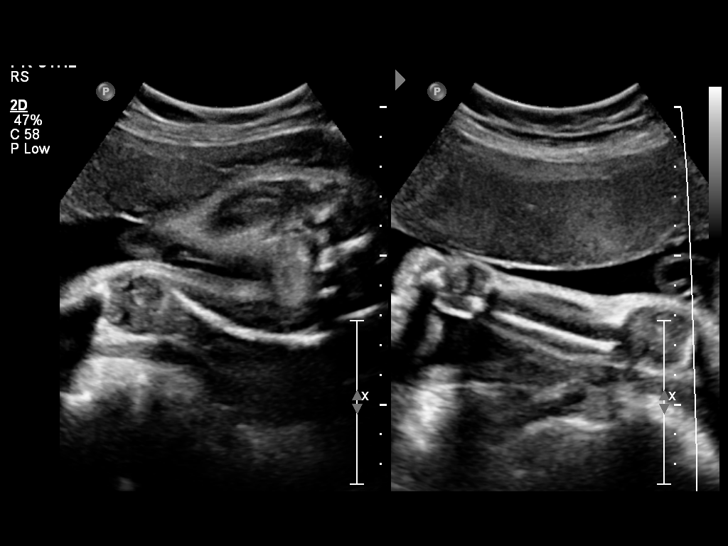
[im 64/91]
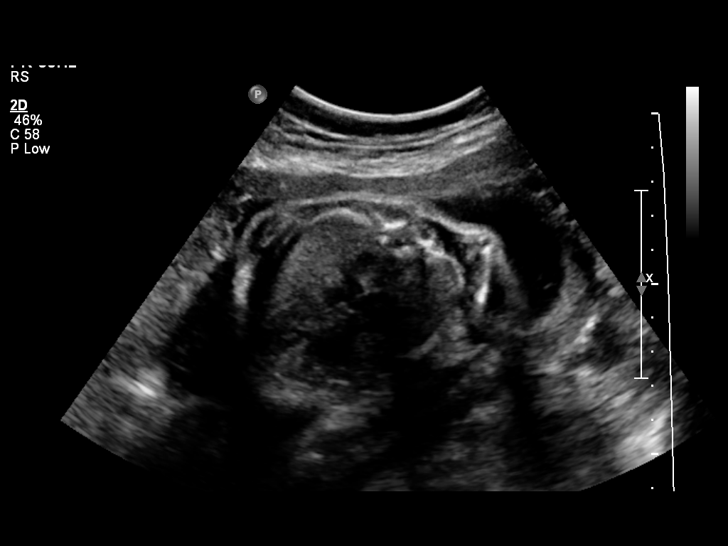
[im 74/91]
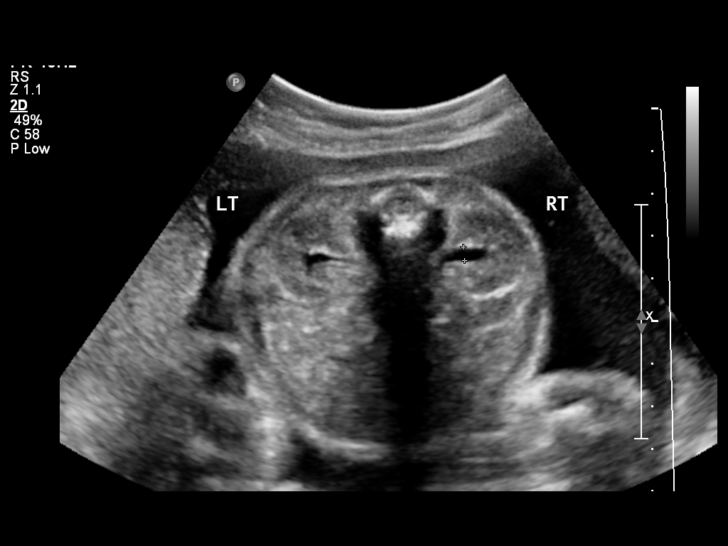
[im 81/91]
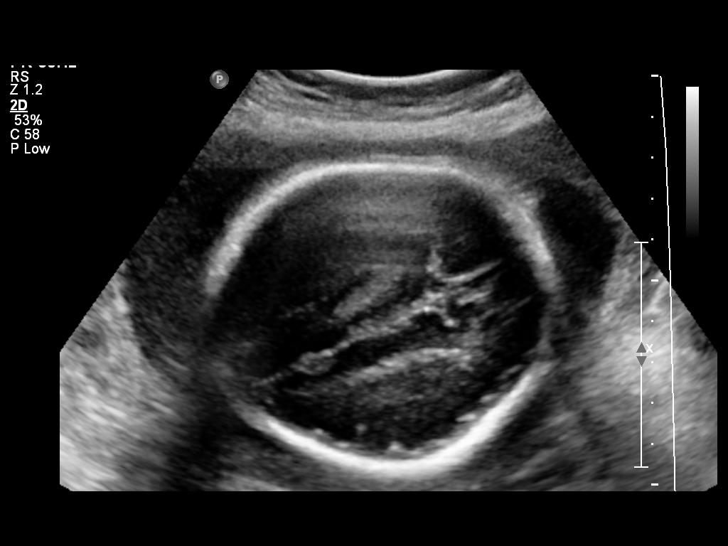
[im 87/91]
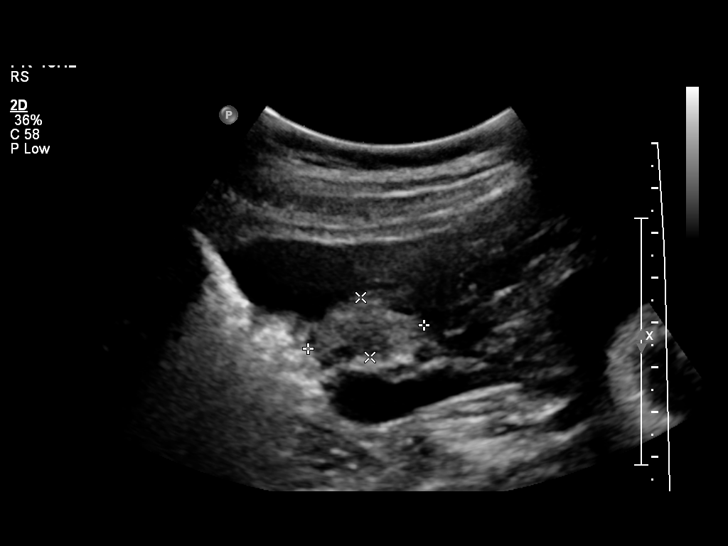

[12 of 28 positions shown; findings below may reference images not displayed]

OBSTETRICS REPORT
                      (Signed Final 10/02/2012 [DATE])

                                                         Faculty Physician
Service(s) Provided

 US OB DETAIL + 14 WK                                  76811.0
Indications

 Detailed fetal anatomic survey
Fetal Evaluation

 Num Of Fetuses:    1
 Fetal Heart Rate:  143                         bpm
 Cardiac Activity:  Observed
 Presentation:      Breech
 Placenta:          Anterior, above cervical os

 Amniotic Fluid
 AFI FV:      Subjectively within normal limits
Biometry

 BPD:     76.1  mm    G. Age:   30w 4d                CI:         74.9   70 - 86
                                                      FL/HC:      19.8   19.6 -

 HC:       279  mm    G. Age:   30w 4d       63  %    HC/AC:      1.07   0.99 -

 AC:     259.9  mm    G. Age:   30w 1d       77  %    FL/BPD:     72.7   71 - 87
 FL:      55.3  mm    G. Age:   29w 1d       40  %    FL/AC:      21.3   20 - 24
 HUM:       50  mm    G. Age:   29w 2d       50  %

 Est. FW:    9865  gm      3 lb 4 oz     69  %
Gestational Age

 LMP:           29w 0d       Date:   03/13/12                 EDD:   12/18/12
 U/S Today:     30w 1d                                        EDD:   12/10/12
 Best:          29w 0d    Det. By:   LMP  (03/13/12)          EDD:   12/18/12
2nd Trimester Genetic Sonogram - Trisomy 21 Screening

 Age:                                             22          Risk=1:   885

 Structural anomalies (inc. cardiac):             No
 Echogenic bowel:                                 No
 Hypoplastic / absent midphalanx 5th Digit:       No
 Wide space 3st-9nd toes:                         N/A
 Pyelectasis:                                     No
 2-vessel umbilical cord:                         No
 Echogenic cardiac foci:                          No
Anatomy

 Cranium:          Appears normal         Aortic Arch:      Appears normal
 Fetal Cavum:      Appears normal         Ductal Arch:      Appears normal
 Ventricles:       Appears normal         Diaphragm:        Appears normal
 Choroid Plexus:   Appears normal         Stomach:          Appears normal, left
                                                            sided
 Cerebellum:       Appears normal         Abdomen:          Appears normal
 Posterior Fossa:  Appears normal         Abdominal Wall:   Appears nml (cord
                                                            insert, abd wall)
 Nuchal Fold:      Not applicable (>20    Cord Vessels:     Appears normal (3
                   wks GA)                                  vessel cord)
 Face:             Appears normal         Kidneys:          Appear normal
                   (orbits and profile)
 Lips:             Appears normal         Bladder:          Appears normal
 Palate:           Not well visualized    Spine:            Appears normal
 Heart:            Appears normal         Lower             Appears normal
                   (4CH, axis, and        Extremities:
                   situs)
 RVOT:             Appears normal         Upper             Appears normal
                                          Extremities:
 LVOT:             Appears normal

 Other:  Female gender. Heels and 5th digit visualized.
Cervix Uterus Adnexa

 Cervical Length:   3         cm

 Cervix:       Normal appearance by transabdominal scan.
 Left Ovary:   Not visualized.
 Right Ovary:  Within normal limits.
 Adnexa:     No abnormality visualized.
Impression

 Siup demonstrating an EGA by ultrasound of 30w 1d. This is
 correlated with expected EGA by of 29w 0d. EFW is currently
 at the 69%.

 No focal fetal or placental abnormalities are noted with a
 good anatomic evaluation possible. No soft markers for Down
 Syndrome are seen.

 Subjectively and quantitatively normal amniotic fluid volume.
 Normal cervical length.

 questions or concerns.

## 2014-11-18 ENCOUNTER — Ambulatory Visit: Payer: Self-pay

## 2015-01-13 ENCOUNTER — Ambulatory Visit (INDEPENDENT_AMBULATORY_CARE_PROVIDER_SITE_OTHER): Payer: BLUE CROSS/BLUE SHIELD | Admitting: Obstetrics & Gynecology

## 2015-01-13 ENCOUNTER — Encounter: Payer: Self-pay | Admitting: Obstetrics & Gynecology

## 2015-01-13 VITALS — BP 127/90 | HR 86 | Ht 61.0 in | Wt 145.0 lb

## 2015-01-13 DIAGNOSIS — N898 Other specified noninflammatory disorders of vagina: Secondary | ICD-10-CM

## 2015-01-13 DIAGNOSIS — R102 Pelvic and perineal pain: Secondary | ICD-10-CM

## 2015-01-13 LAB — WET PREP, GENITAL
TRICH WET PREP: NONE SEEN
Yeast Wet Prep HPF POC: NONE SEEN

## 2015-01-13 LAB — POCT PREGNANCY, URINE: Preg Test, Ur: NEGATIVE

## 2015-01-13 NOTE — Progress Notes (Signed)
   Subjective:    Patient ID: Sarah Day, female    DOB: April 17, 1991, 24 y.o.   MRN: [id number]  HPI 24 yo A P2 here with a 1 month h/o pelvic pain with radiation to her lower back. Sex has been painful for the past month as well. She has not taken any pain relievers at this point.  She uses condoms for contraception but has not had a period since 5/15 as when she last finished taking depo provera.  She also complains of a vaginal discharge, non-odorous, but itchy.   Review of Systems Pap and cervical cultures were negative 2014.    Objective:   Physical Exam WNWHAFNAD Breathing and ambulating normally Abd- benign EG- normal Vagina- non-odorous vaginal discharge, generally white, appears sticky NSSA, NT, mobile, no adnexal masses or tenderness       Assessment & Plan:  Vaginal discharge/itching- wet prep and cultures sent Pelvic pain- I rec'd IBU 800 mg TID/prn Gyn u/s scheduled

## 2015-01-16 ENCOUNTER — Telehealth: Payer: Self-pay

## 2015-01-16 NOTE — Telephone Encounter (Signed)
-----   Message from Allie BossierMyra C Dove, MD sent at 01/16/2015  2:05 PM EDT ----- Can you please call her in flagyl 500 mg BID for 7 days. Thanks

## 2015-01-16 NOTE — Telephone Encounter (Signed)
Called pt and LM on husbands phone that there [facility] [address]  She will need to take the medication twice a day for seven days.  If she has any questions please give the office a call.

## 2015-01-17 LAB — GC/CHLAMYDIA PROBE AMP, URINE
CHLAMYDIA, SWAB/URINE, PCR: NEGATIVE
GC PROBE AMP, URINE: NEGATIVE

## 2015-01-23 ENCOUNTER — Ambulatory Visit (HOSPITAL_COMMUNITY)
Admission: RE | Admit: 2015-01-23 | Discharge: 2015-01-23 | Disposition: A | Discharge status: Home / Self Care | Payer: BLUE CROSS/BLUE SHIELD | Source: Ambulatory Visit | Attending: Obstetrics & Gynecology | Admitting: Obstetrics & Gynecology

## 2015-01-23 DIAGNOSIS — R102 Pelvic and perineal pain: Secondary | ICD-10-CM | POA: Diagnosis not present

## 2015-02-17 ENCOUNTER — Encounter: Payer: Self-pay | Admitting: Obstetrics & Gynecology

## 2015-02-17 ENCOUNTER — Ambulatory Visit (INDEPENDENT_AMBULATORY_CARE_PROVIDER_SITE_OTHER): Payer: BLUE CROSS/BLUE SHIELD | Admitting: Obstetrics & Gynecology

## 2015-02-17 VITALS — BP 117/74 | HR 69 | Temp 98.0°F | Ht 60.0 in | Wt 144.0 lb

## 2015-02-17 DIAGNOSIS — N898 Other specified noninflammatory disorders of vagina: Secondary | ICD-10-CM | POA: Diagnosis not present

## 2015-02-17 MED ORDER — METRONIDAZOLE 500 MG PO TABS: 500.0000 mg | ORAL_TABLET | Freq: Two times a day (BID) | ORAL | Status: DC

## 2015-02-17 NOTE — Progress Notes (Signed)
   Subjective:    Patient ID: Sarah Day, female    DOB: 1990/12/16, 24 y.o.   MRN: [id number]  HPI  24 yo [place] female here for follow up of her gyn u/s done for 1 month of pelvic pain. She has been taking the IBU and after her LMP her pain is much better. She still complains of a vaginal discharge. She denies dyspareunia.  Review of Systems Her u/s showed a 5 cm physiologic cyst    Objective:   Physical Exam WNWHAFNAD Abd- benign Ambulating and breathing normally       Assessment & Plan:  Vaginal discharge- wet prep at last visit showed a few clue cells so I will prescribe flagyl RTC for annual exam in a year

## 2015-02-17 NOTE — Progress Notes (Signed)
Interpreter Glendon AxeLek Fleeta EmmerSui

## 2015-05-19 ENCOUNTER — Ambulatory Visit: Payer: Self-pay | Admitting: Family Medicine

## 2015-06-23 ENCOUNTER — Ambulatory Visit (INDEPENDENT_AMBULATORY_CARE_PROVIDER_SITE_OTHER): Payer: BLUE CROSS/BLUE SHIELD | Admitting: Family Medicine

## 2015-06-23 ENCOUNTER — Other Ambulatory Visit (HOSPITAL_COMMUNITY)
Admission: RE | Admit: 2015-06-23 | Discharge: 2015-06-23 | Disposition: A | Discharge status: Home / Self Care | Payer: BLUE CROSS/BLUE SHIELD | Source: Ambulatory Visit | Attending: Family Medicine | Admitting: Family Medicine

## 2015-06-23 VITALS — BP 118/72 | HR 99 | Temp 98.3°F | Ht 63.0 in | Wt 146.4 lb

## 2015-06-23 DIAGNOSIS — Z113 Encounter for screening for infections with a predominantly sexual mode of transmission: Secondary | ICD-10-CM | POA: Insufficient documentation

## 2015-06-23 DIAGNOSIS — N898 Other specified noninflammatory disorders of vagina: Secondary | ICD-10-CM

## 2015-06-23 DIAGNOSIS — N390 Urinary tract infection, site not specified: Secondary | ICD-10-CM

## 2015-06-23 DIAGNOSIS — R300 Dysuria: Secondary | ICD-10-CM

## 2015-06-23 LAB — POCT URINALYSIS DIPSTICK
BILIRUBIN UA: NEGATIVE
GLUCOSE UA: NEGATIVE
KETONES UA: NEGATIVE
Nitrite, UA: NEGATIVE
Protein, UA: NEGATIVE
SPEC GRAV UA: 1.02
Urobilinogen, UA: 0.2
pH, UA: 7.5

## 2015-06-23 LAB — POCT WET PREP (WET MOUNT): Clue Cells Wet Prep Whiff POC: NEGATIVE

## 2015-06-23 LAB — POCT UA - MICROSCOPIC ONLY

## 2015-06-23 MED ORDER — CEPHALEXIN 500 MG PO CAPS: 500.0000 mg | ORAL_CAPSULE | Freq: Three times a day (TID) | ORAL | Status: DC

## 2015-06-23 MED ORDER — CEPHALEXIN 500 MG PO CAPS: 500.0000 mg | ORAL_CAPSULE | Freq: Four times a day (QID) | ORAL | Status: DC

## 2015-06-23 NOTE — Progress Notes (Signed)
    Subjective: [id number] HPI: Patient is a 24 y.o. female presenting to clinic today for same day appointment. Concerns today include:  In house interpretation provided today for [facility] language.  1. Dysuria Started about 1 week ago.  She endorses nausea without vomiting.  Denies hematuria, fevers.  Occasional chill.  She does endorse some back pain that started around the same time.  Endorses frequency and hesitancy.  Patient has taken Aleve to help with symptoms.  Last dose yesterday  2. Vaginal discharge Patient reports that vaginal discharge and itching has been intermittent for several months.  She is sexually active with her husband only.  Social History Reviewed FamHx and MedHx updated.  Please see EMR. Health Maintenance: Flu shot due  ROS: All other systems reviewed and are negative.  Objective: Office vital signs reviewed. BP 118/72 mmHg  Pulse 99  Temp(Src) 98.3 F (36.8 C) (Oral)  Ht  (1.6 m)  Wt 146 lb 6.4 oz (66.407 kg)  BMI 25.94 kg/m2  LMP 06/01/2015 (Approximate)  Physical Examination:  General: Awake, alert, well nourished, NAD HEENT: Normal, MMM GI: soft, NT/ND,+BS x4, no masses GU: +suprapubic TTP, no CVA TTP, external vaginal tissue without lesions, cervix easily visualized, moderate leukorrhea from cervical os, no punctate lesions, no cervical motion TTP, no adnexal masses MSK: Normal gait and station Skin: dry, intact, no rashes or lesions  Results for orders placed or performed in visit on 06/23/15 (from the past 24 hour(s))  Urinalysis Dipstick     Status: Abnormal   Collection Time: 06/23/15  2:30 PM  Result Value Ref Range   Color, UA YELLOW    Clarity, UA CLOUDY    Glucose, UA NEG    Bilirubin, UA NEG    Ketones, UA NEG    Spec Grav, UA 1.020    Blood, UA SMALL    pH, UA 7.5    Protein, UA NEG    Urobilinogen, UA 0.2    Nitrite, UA NEG    Leukocytes, UA large (3+) (A) Negative   Narrative   Reflex to microscopic   POCT  UA - Microscopic Only     Status: Abnormal   Collection Time: 06/23/15  2:30 PM  Result Value Ref Range   WBC, Ur, HPF, POC LOADED    RBC, urine, microscopic 3-8    Bacteria, U Microscopic 1+    Epithelial cells, urine per micros 5-10    Assessment/ Plan: 24 y.o. female with  1. Dysuria.  Large Leuks, small blood.  Will treat for UTI - Urinalysis Dipstick - POCT UA - Microscopic Only  2. Vaginal discharge.  No evidence of BV, yeast or trich on wet prep.   - POCT Wet Prep [facility]) - Cervicovaginal ancillary only: GC/CT sent of in setting of large leuks on wet prep and UA.  3. UTI (lower urinary tract infection) - Keflex  TID x7 days - UCx sent - Return precautions reviewed.  Raliegh Ip, DO PGY-2, [facility]

## 2015-06-23 NOTE — Patient Instructions (Addendum)
Take your antibiotic 3 times a day for the next 7 days.  Return to clinic if your symptoms get worse.  If you develop high fevers or vomiting, please go to the emergency room.   Urinary Tract Infection Urinary tract infections (UTIs) can develop anywhere along your urinary tract. Your urinary tract is your body's drainage system for removing wastes and extra water. Your urinary tract includes two kidneys, two ureters, a bladder, and a urethra. Your kidneys are a pair of bean-shaped organs. Each kidney is about the size of your fist. They are located below your ribs, one on each side of your spine. CAUSES Infections are caused by microbes, which are microscopic organisms, including fungi, viruses, and bacteria. These organisms are so small that they can only be seen through a microscope. Bacteria are the microbes that most commonly cause UTIs. SYMPTOMS  Symptoms of UTIs may vary by age and gender of the patient and by the location of the infection. Symptoms in young women typically include a frequent and intense urge to urinate and a painful, burning feeling in the bladder or urethra during urination. Older women and men are more likely to be tired, shaky, and weak and have muscle aches and abdominal pain. A fever may mean the infection is in your kidneys. Other symptoms of a kidney infection include pain in your back or sides below the ribs, nausea, and vomiting. DIAGNOSIS To diagnose a UTI, your caregiver will ask you about your symptoms. Your caregiver will also ask you to provide a urine sample. The urine sample will be tested for bacteria and white blood cells. White blood cells are made by your body to help fight infection. TREATMENT  Typically, UTIs can be treated with medication. Because most UTIs are caused by a bacterial infection, they usually can be treated with the use of antibiotics. The choice of antibiotic and length of treatment depend on your symptoms and the type of bacteria causing your  infection. HOME CARE INSTRUCTIONS  If you were prescribed antibiotics, take them exactly as your caregiver instructs you. Finish the medication even if you feel better after you have only taken some of the medication.  Drink enough water and fluids to keep your urine clear or pale yellow.  Avoid caffeine, tea, and carbonated beverages. They tend to irritate your bladder.  Empty your bladder often. Avoid holding urine for long periods of time.  Empty your bladder before and after sexual intercourse.  After a bowel movement, women should cleanse from front to back. Use each tissue only once. SEEK MEDICAL CARE IF:   You have back pain.  You develop a fever.  Your symptoms do not begin to resolve within 3 days. SEEK IMMEDIATE MEDICAL CARE IF:   You have severe back pain or lower abdominal pain.  You develop chills.  You have nausea or vomiting.  You have continued burning or discomfort with urination. MAKE SURE YOU:   Understand these instructions.  Will watch your condition.  Will get help right away if you are not doing well or get worse.   This information is not intended to replace advice given to you by your health care provider. Make sure you discuss any questions you have with your health care provider.   Document Released: 06/12/2005 Document Revised: 05/24/2015 Document Reviewed: 10/11/2011 Elsevier Interactive Patient Education [employer]

## 2015-06-26 LAB — URINE CULTURE: Colony Count: >=100000

## 2015-06-26 LAB — CERVICOVAGINAL ANCILLARY ONLY
CHLAMYDIA, DNA PROBE: NEGATIVE
Neisseria Gonorrhea: NEGATIVE

## 2016-05-28 NOTE — Progress Notes (Signed)
Subjective:     Patient ID: Sarah DanielsHmlem Rallo, female   DOB: 1991/06/28, 25 y.o.   MRN: [id number]  HPI  Sarah Day is a 25yo female presenting today for general annual exam. Visit conducted with aid of Jarai interpretor. -Denies any new medical history or diagnoses -Denies any new family history -Denies chronic medication. Takes Aleve as needed. -Acute concerns include feeling of shakiness prior to eating followed by feeling full and dizzy after eating. Has had symptoms for 2-3 months. -Only eats twice a day. Usually female consists of rice, veggies, meats. -Last menstrual period August 29. Reports periods are not heavy. -Denies shortness of breath, chest pain, palpitations, fevers. -Does note occasional weakness and fatigue -Denies vaginal bleeding. - Last Pap Smear 09/2012 negative for malignancy. Currently due for pap smear.  -History of anemia noted. Previous hemoglobin 9.5 and 2014, improved to 11.1 at last check in December 2014. - Nonsmoker  Review of Systems Per HPI. Other systems negative.    Objective:   Physical Exam  Constitutional: She appears well-developed and well-nourished. No distress.  Eyes:  Pink conjunctivae  Cardiovascular: Normal rate and regular rhythm.   No murmur heard. Pulmonary/Chest: Effort normal. No respiratory distress. She has no wheezes.  Abdominal: Soft. She exhibits no distension.  Mild tenderness in left lower quadrant (chronic for many years per patient)  Genitourinary:  Genitourinary Comments: No genital ulcers noted.  Adnexal tenderness on left.  Skin: No rash noted.  Pale  Psychiatric: She has a normal mood and affect. Her behavior is normal.      Assessment and Plan:     1. Dizziness -Suspect secondary to low caloric intake. Other differentials include anemia, hypothyroidism,pregnancy, electrolyte imbalance, dumping syndrome. -We will check  CMP, CBC, TSH, urine pregnancy -Recommend eating at least 3 meals per day with 2 snacks in between -Left  adnexal tenderness noted. Denies fever, acute abdominal pain, vaginal discharge. Reports history of chronic left lower quadrant pain for many years and states this is unchanged. Will not treat for PID at this time but low threshold for initiating antibiotics. Will obtain GC/Chlamydia and treat if positive. -Return in 3-4 weeks if symptoms not improved  2. Annual physical exam -Will obtain Pap smear today -Labwork per above

## 2016-05-29 ENCOUNTER — Ambulatory Visit (INDEPENDENT_AMBULATORY_CARE_PROVIDER_SITE_OTHER): Payer: BLUE CROSS/BLUE SHIELD | Admitting: Family Medicine

## 2016-05-29 ENCOUNTER — Other Ambulatory Visit (HOSPITAL_COMMUNITY)
Admission: RE | Admit: 2016-05-29 | Discharge: 2016-05-29 | Disposition: A | Discharge status: Home / Self Care | Payer: BLUE CROSS/BLUE SHIELD | Source: Ambulatory Visit | Attending: Family Medicine | Admitting: Family Medicine

## 2016-05-29 VITALS — BP 134/78 | HR 74 | Temp 98.4°F | Ht 63.0 in | Wt 143.2 lb

## 2016-05-29 DIAGNOSIS — Z01419 Encounter for gynecological examination (general) (routine) without abnormal findings: Secondary | ICD-10-CM | POA: Diagnosis not present

## 2016-05-29 DIAGNOSIS — Z0000 Encounter for general adult medical examination without abnormal findings: Secondary | ICD-10-CM

## 2016-05-29 DIAGNOSIS — R42 Dizziness and giddiness: Secondary | ICD-10-CM | POA: Diagnosis not present

## 2016-05-29 DIAGNOSIS — Z113 Encounter for screening for infections with a predominantly sexual mode of transmission: Secondary | ICD-10-CM | POA: Insufficient documentation

## 2016-05-29 LAB — COMPLETE METABOLIC PANEL WITH GFR
ALT: 10 U/L (ref 6–29)
AST: 14 U/L (ref 10–30)
Albumin: 4.3 g/dL (ref 3.6–5.1)
Alkaline Phosphatase: 70 U/L (ref 33–115)
BUN: 9 mg/dL (ref 7–25)
CALCIUM: 9.6 mg/dL (ref 8.6–10.2)
CHLORIDE: 105 mmol/L (ref 98–110)
CO2: 27 mmol/L (ref 20–31)
CREATININE: 0.52 mg/dL (ref 0.50–1.10)
GFR, Est Non African American: >89 mL/min (ref >=60)
Glucose, Bld: 82 mg/dL (ref 65–99)
POTASSIUM: 4 mmol/L (ref 3.5–5.3)
Sodium: 139 mmol/L (ref 135–146)
Total Bilirubin: 0.4 mg/dL (ref 0.2–1.2)
Total Protein: 7.8 g/dL (ref 6.1–8.1)

## 2016-05-29 LAB — CBC
HEMATOCRIT: 36.7 % (ref 35.0–45.0)
HEMOGLOBIN: 11.4 g/dL — AB (ref 11.7–15.5)
MCH: 21.9 pg — AB (ref 27.0–33.0)
MCHC: 31.1 g/dL — ABNORMAL LOW (ref 32.0–36.0)
MCV: 70.6 fL — AB (ref 80.0–100.0)
PLATELETS: 336 10*3/uL (ref 140–400)
RBC: 5.2 MIL/uL — AB (ref 3.80–5.10)
RDW: 15 % (ref 11.0–15.0)
WBC: 8.1 10*3/uL (ref 3.8–10.8)

## 2016-05-29 LAB — POCT URINE PREGNANCY: Preg Test, Ur: NEGATIVE

## 2016-05-29 LAB — TSH: TSH: 0.99 mIU/L

## 2016-05-29 NOTE — Patient Instructions (Signed)
Thank you so much for coming to visit today! I suspect your dizziness is from not eating enough during the day! Please make sure you eat at least 3 meals with 2 snacks. We will check several labs today due to your dizziness. Please return in 3-4 weeks if your symptoms fail to improve.  Dr. Caroleen Hammanumley

## 2016-05-30 LAB — CERVICOVAGINAL ANCILLARY ONLY
CHLAMYDIA, DNA PROBE: NEGATIVE
NEISSERIA GONORRHEA: NEGATIVE

## 2016-05-30 LAB — CYTOLOGY - PAP

## 2016-06-03 ENCOUNTER — Encounter: Payer: Self-pay | Admitting: Family Medicine

## 2016-06-27 ENCOUNTER — Encounter: Payer: Self-pay | Admitting: Internal Medicine

## 2016-06-27 ENCOUNTER — Ambulatory Visit (INDEPENDENT_AMBULATORY_CARE_PROVIDER_SITE_OTHER): Payer: BLUE CROSS/BLUE SHIELD | Admitting: Internal Medicine

## 2016-06-27 DIAGNOSIS — Z23 Encounter for immunization: Secondary | ICD-10-CM | POA: Diagnosis not present

## 2016-06-27 DIAGNOSIS — R42 Dizziness and giddiness: Secondary | ICD-10-CM | POA: Diagnosis not present

## 2016-06-27 DIAGNOSIS — R519 Headache, unspecified: Secondary | ICD-10-CM | POA: Insufficient documentation

## 2016-06-27 DIAGNOSIS — R51 Headache: Secondary | ICD-10-CM

## 2016-06-27 NOTE — Patient Instructions (Signed)
It was nice meeting you today Sarah Day!  If your headaches start happening more frequently or do not improve with Tylenol, please call to schedule another appointment.   If you have any questions or concerns, please feel free to call the clinic.   Be well,  Dr. Natale MilchLancaster

## 2016-06-27 NOTE — Assessment & Plan Note (Signed)
As occurring only once weekly specifically when patient is tired, and as controlled with Tylenol, will not begin medication today or proceed with further work-up at this time. No abnormalities on neuro exam. No red flags. Patient to return to care if headaches worsen.

## 2016-06-27 NOTE — Progress Notes (Signed)
   Subjective:    Patient ID: Sarah Day, female    DOB: 26-Jun-1991, 25 y.o.   MRN: [id number]  HPI  Estanislado SpireJarai interpreter present for encounter.   Patient presenting for dizziness f/u.   Dizziness/Headache Patient seen by Dr. Caroleen Hammanumley on 09/13 for dizziness. Seemed 2/2 inadequate caloric intake at that time, and patient encouraged to eat at least three meals per day, preferably with two snacks. Labs also obtained at that time which were all normal. Patient here today to f/u. Reports dizziness has resolved. Is having headaches about once per week which resolve with Tylenol. Thinks headaches occur when she has worked the late shift and has not slept enough. Has no other complaints.   Review of Systems See HPI.     Objective:   Physical Exam  Constitutional: She is oriented to person, place, and time. She appears well-developed and well-nourished. No distress.  HENT:  Head: Normocephalic and atraumatic.  Eyes: EOM are normal. Right eye exhibits no discharge. Left eye exhibits no discharge.  Pulmonary/Chest: Effort normal. No respiratory distress.  Neurological: She is alert and oriented to person, place, and time. No cranial nerve deficit. Coordination normal.  Skin: Skin is warm and dry.  Psychiatric: She has a normal mood and affect. Her behavior is normal.      Assessment & Plan:  Headache As occurring only once weekly specifically when patient is tired, and as controlled with Tylenol, will not begin medication today or proceed with further work-up at this time. No abnormalities on neuro exam. No red flags. Patient to return to care if headaches worsen.   Tarri AbernethyAbigail J Lancaster, MD, MPH PGY-2 [facility] Pager [phone]

## 2016-06-27 NOTE — Progress Notes (Signed)
fu

## 2016-12-30 IMAGING — US US TRANSVAGINAL NON-OB
1 series · 14 of 25 positions shown · non-contrast
Comparison: None

CLINICAL DATA: Pelvic pain x1 month



[Series 1: us non-ob tv/pel · 14 of 55 slices shown]
[im 1/55]
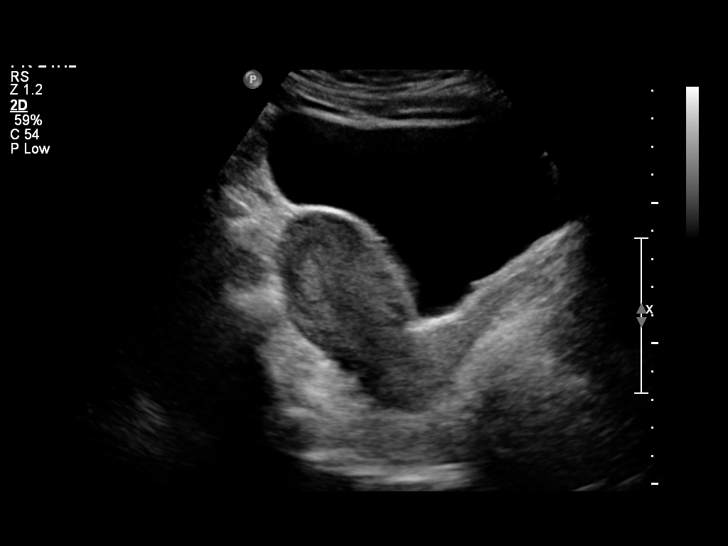
[im 5/55]
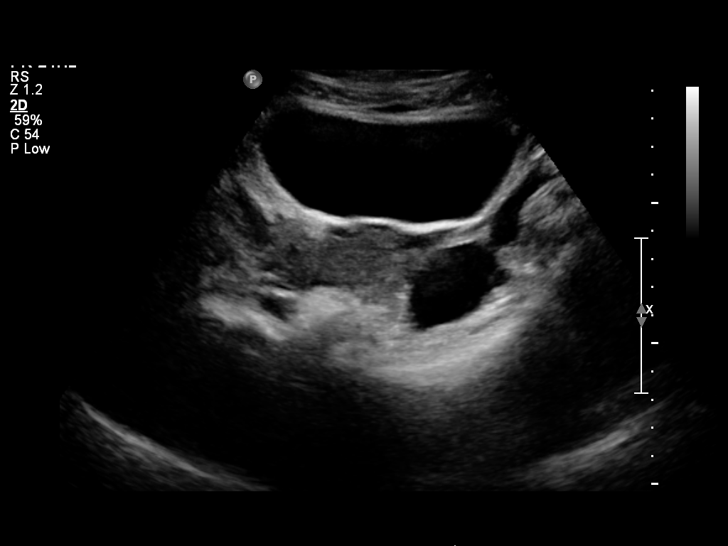
[im 10/55]
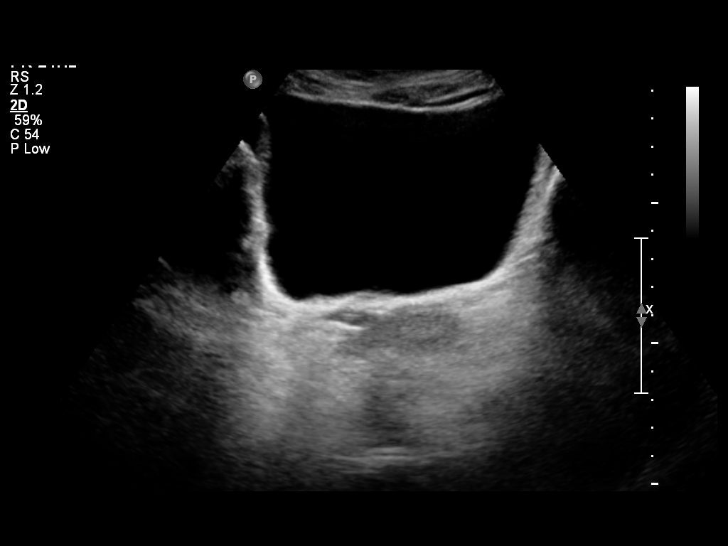
[im 14/55]
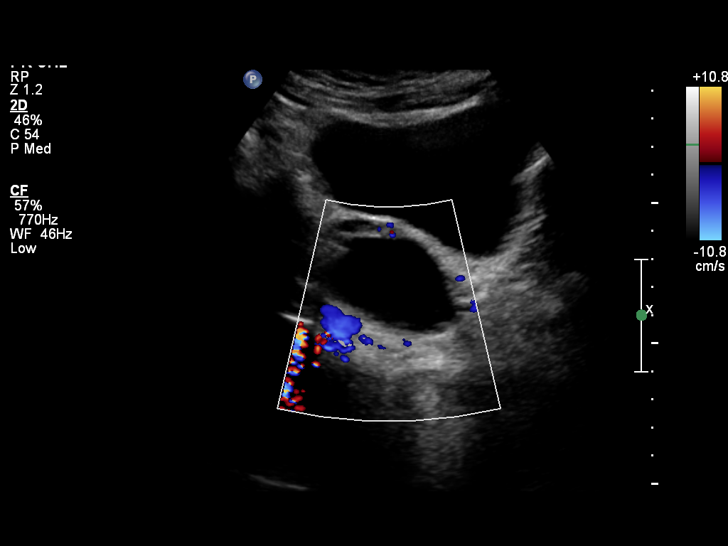
[im 19/55]
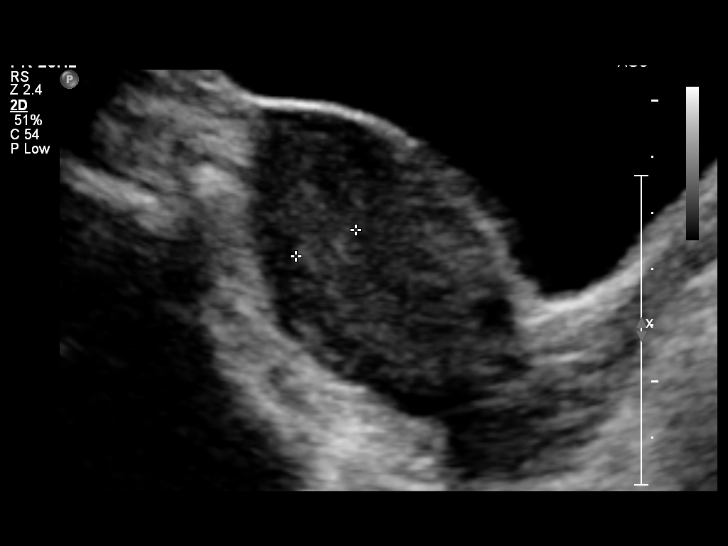
[im 21/55]
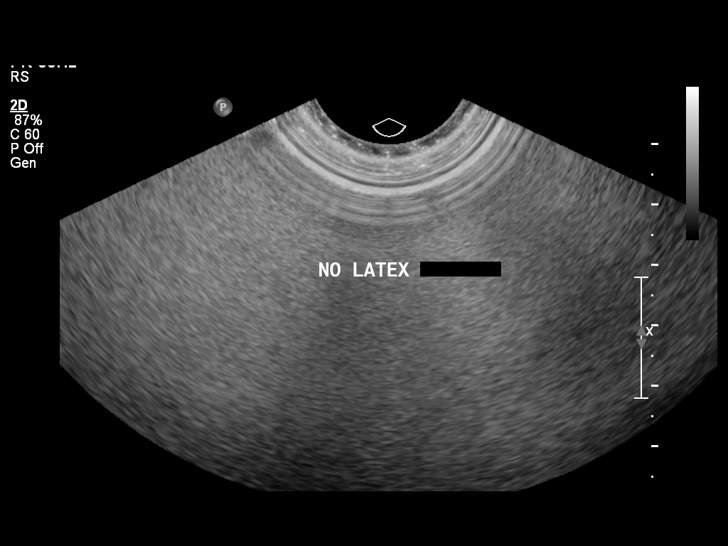
[im 25/55]
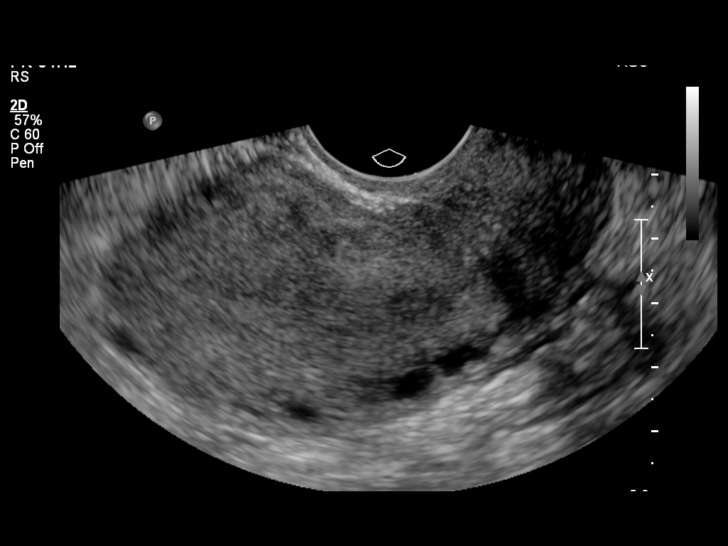
[im 30/55]
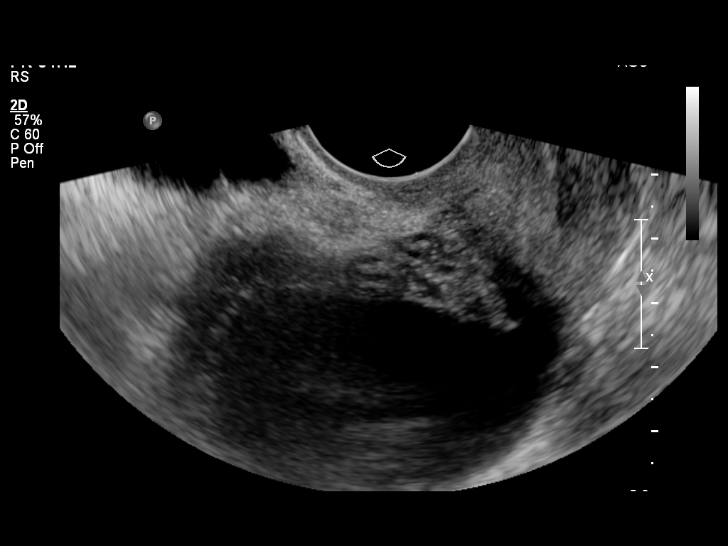
[im 34/55]
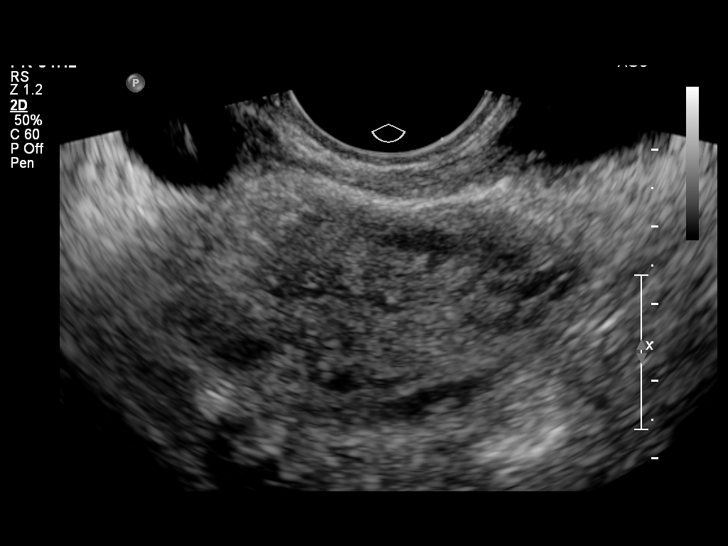
[im 37/55]
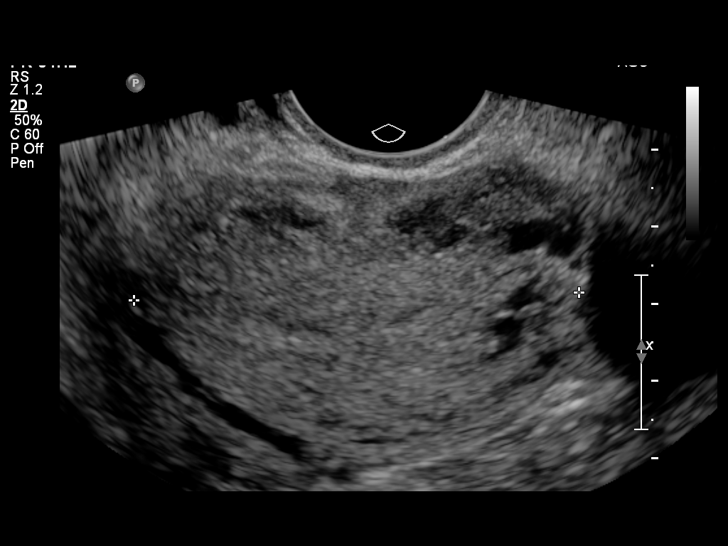
[im 41/55]
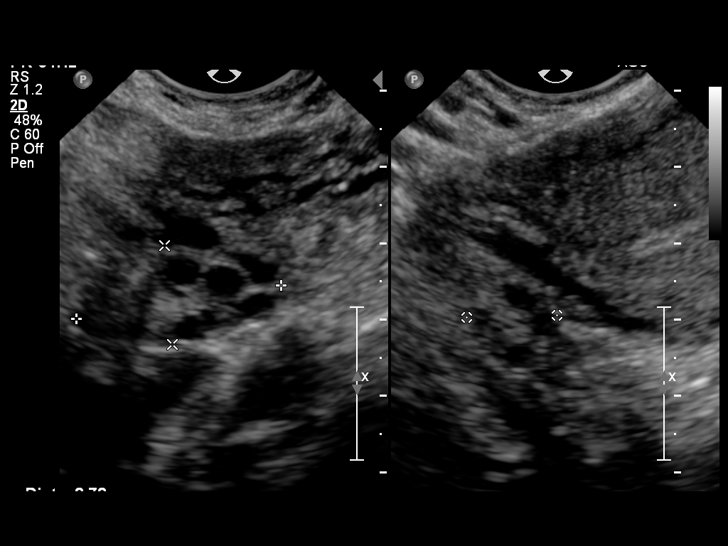
[im 46/55]
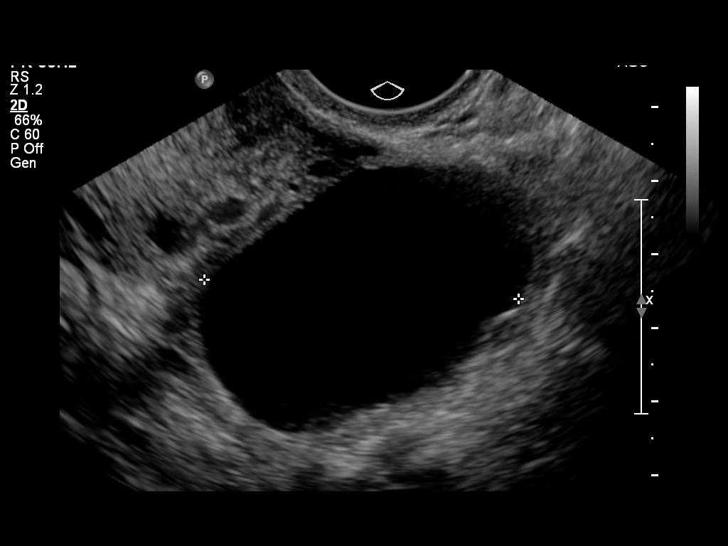
[im 50/55]
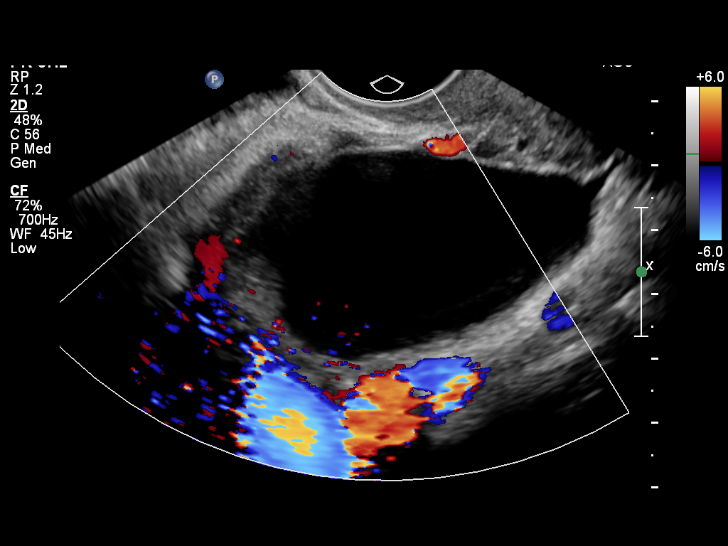
[im 55/55]
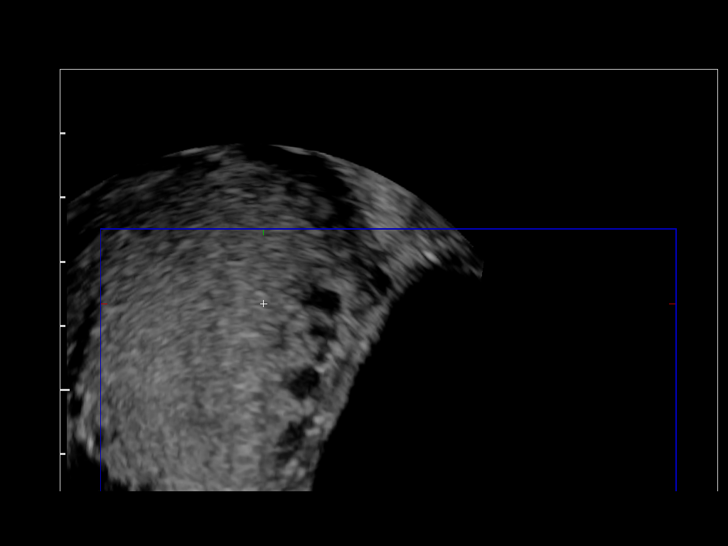

[14 of 25 positions shown; findings below may reference images not displayed]

FINDINGS: Uterus

Measurements: 8.7 x 4.2 x 5.8 cm. No fibroids or other mass
visualized.

Endometrium

Thickness: 4 mm.  No focal abnormality visualized.

Right ovary

Measurements: 2.7 x 1.3 x 1.2 cm. Normal appearance/no adnexal mass.

Left ovary

Measurements: 6.0 x 3.8 x 4.8 cm. 5.1 x 3.3 x 4.3 cm simple
cyst/follicle.

Other findings

No free fluid.
IMPRESSION: 5.1 cm simple left ovarian cyst/follicle, likely physiologic.
Consider follow-up pelvic ultrasound in 6-12 weeks if clinically
warranted.

Otherwise negative pelvic ultrasound.

## 2017-06-19 ENCOUNTER — Encounter: Payer: Self-pay | Admitting: Internal Medicine

## 2017-06-26 ENCOUNTER — Ambulatory Visit (INDEPENDENT_AMBULATORY_CARE_PROVIDER_SITE_OTHER): Payer: BLUE CROSS/BLUE SHIELD | Admitting: Internal Medicine

## 2017-06-26 ENCOUNTER — Encounter: Payer: Self-pay | Admitting: Internal Medicine

## 2017-06-26 DIAGNOSIS — R1011 Right upper quadrant pain: Secondary | ICD-10-CM

## 2017-06-26 DIAGNOSIS — Z23 Encounter for immunization: Secondary | ICD-10-CM | POA: Diagnosis not present

## 2017-06-26 DIAGNOSIS — R109 Unspecified abdominal pain: Secondary | ICD-10-CM | POA: Insufficient documentation

## 2017-06-26 NOTE — Progress Notes (Signed)
26 y.o. year old female presents for well woman/preventative visit and annual GYN examination.  Acute Concerns: Abdominal pain Ongoing for about a month. Occurs twice daily, usually when she is sitting at work. Is not better or worse before or after eating. Lasts about an hour at a time. Has not taken any medication to help with pain. Pain is sharp and sometimes radiates to her back. Denies nausea, vomiting, constipation or diarrhea. Normal appetite. Denies dysuria, hematuria, increased frequency.   Diet: Well-balanced. Rarely eats out. Typically cooks meals consisting of rice and chicken at home. Drinks only water.   Exercise: Walks  Sexual/Birth History: Sexually active. Not on any form of contraception though reports using condoms with every sexual encounter.   Birth Control: Condoms  Surgical History: No past surgical history on file.  Allergies: No Known Allergies  Social:  Social History   Social History  . Marital status: Married    Spouse name: N/A  . Number of children: N/A  . Years of education: N/A   Occupational History  .  Other    works at [employer]   Social History Main Topics  . Smoking status: Never Smoker  . Smokeless tobacco: Never Used  . Alcohol use No  . Drug use: No  . Sexual activity: Yes    Birth control/ protection: Injection   Other Topics Concern  . None   Social History Narrative   Works for a [employer].    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 Influenza,inj,Quad PF,6+ Mos 08/30/2013, 06/22/2014, 06/27/2016  . Tdap 12/16/2012    Cancer Screening:  Pap Smear: Last performed 05/29/2016 with normal results  Mammogram: N/A  Colonoscopy: N/A  Physical Exam: VITALS: Reviewed GEN: Pleasant female, NAD HEENT: Normocephalic, PERRL, EOMI, no scleral icterus, bilateral TM pearly grey, nasal  septum midline, MMM, uvula midline, no anterior or posterior lymphadenopathy, no thyromegaly CARDIAC:RRR, S1 and S2 present, no murmur, no heaves/thrills RESP: CTAB, normal effort ABD: Soft, non-distended, +BS. Mild TTP in RUQ without guarding. No flank tenderness, neg CVA tenderness.  EXT: No edema, 2+ radial and DP pulses SKIN: Warm and dry, no rash  ASSESSMENT & PLAN: 26 y.o. female presents for annual well woman/preventative exam and GYN exam. Please see problem specific assessment and plan.   Abdominal pain Ongoing x1 mo. Possibly gallbladder etiology given location of pain, however not related to eating as might expect with cholecystitis. Patient afebrile and without any signs or symptoms suggestive of systemic infection, so less concern for cholangitis or biliary obstruction. No urinary sx or CVA tenderness, so less concern for pyelonephritis. Patien with no history of EtOH abuse or other reason for possible liver etiology, and pain not consistent with pancreatitis either. As pain currently mild, will continue to monitor for now. Can take Tylenol or ibuprofen PRN. If pain worsens, or if patient develops signs of systemic illness, will proceed with RUQ [place].    Tarri Abernethy, MD, MPH PGY-3 [facility] Pager [phone]

## 2017-06-26 NOTE — Patient Instructions (Addendum)
It was nice seeing you again today Sarah Day!  For abdominal pain, you can take Tylenol or ibuprofen. If the pain gets worse, or if you start having nausea, vomiting, or fevers with the pain, please let us know.   I will see you back in one year for your next physical, or sooner if you need [place].   If you have any questions or concerns, please feel free to call the clinic.   Be well,  Dr. Natale Milch

## 2017-06-26 NOTE — Assessment & Plan Note (Signed)
Ongoing x1 mo. Possibly gallbladder etiology given location of pain, however not related to eating as might expect with cholecystitis. Patient afebrile and without any signs or symptoms suggestive of systemic infection, so less concern for cholangitis or biliary obstruction. No urinary sx or CVA tenderness, so less concern for pyelonephritis. Patien with no history of EtOH abuse or other reason for possible liver etiology, and pain not consistent with pancreatitis either. As pain currently mild, will continue to monitor for now. Can take Tylenol or ibuprofen PRN. If pain worsens, or if patient develops signs of systemic illness, will proceed with RUQ [place].

## 2018-07-21 ENCOUNTER — Ambulatory Visit (INDEPENDENT_AMBULATORY_CARE_PROVIDER_SITE_OTHER): Payer: BLUE CROSS/BLUE SHIELD | Admitting: Family Medicine

## 2018-07-21 ENCOUNTER — Other Ambulatory Visit: Payer: Self-pay

## 2018-07-21 VITALS — BP 90/62 | HR 81 | Temp 98.7°F | Wt 151.6 lb

## 2018-07-21 DIAGNOSIS — R109 Unspecified abdominal pain: Secondary | ICD-10-CM | POA: Diagnosis not present

## 2018-07-21 DIAGNOSIS — R300 Dysuria: Secondary | ICD-10-CM

## 2018-07-21 LAB — POCT URINALYSIS DIP (MANUAL ENTRY)
BILIRUBIN UA: NEGATIVE mg/dL
Bilirubin, UA: NEGATIVE
Glucose, UA: NEGATIVE mg/dL
Leukocytes, UA: NEGATIVE
Nitrite, UA: NEGATIVE
PROTEIN UA: NEGATIVE mg/dL
RBC UA: NEGATIVE
SPEC GRAV UA: 1.02 (ref 1.010–1.025)
UROBILINOGEN UA: 0.2 U/dL
pH, UA: 7.5 (ref 5.0–8.0)

## 2018-07-21 NOTE — Assessment & Plan Note (Signed)
Seems musculoskeletal.  No signs of pyleo or lung disease or shingles or intraabdominal disease.   Treat symptomatically and follow up if does not resolve

## 2018-07-21 NOTE — Patient Instructions (Addendum)
Good to see you today!  Thanks for coming in.  I think you have a muscle strain Use heat on the area at least twice a day  Use Tylenol twice a day   Come back in 2 weeks if you still have pain and we may need to do an xray

## 2018-07-21 NOTE — Progress Notes (Signed)
Subjective  In person interpreter used   Sarah Day is a 27 y.o. female is presenting with the following  RIGHT FLANK PAIN Having on and off for weeks.  No specific injury.  Using tylenol.  Pain comes and goes and does not keep her from doing anything. Is worse with movement. Does have intermittent burning with urination. No fever or nausea and vomiting or diarrhea or abdomen pain LMP in October has regularly  Chief Complaint noted Review of Symptoms - see HPI PMH - Smoking status noted.    Objective Vital Signs reviewed BP 90/62   Pulse 81   Temp 98.7 F (37.1 C) (Oral)   Wt 151 lb 9.6 oz (68.8 kg)   SpO2 98%   BMI 26.85 kg/m  Indicates pain diffusely over R mid flank area No CVAT Abdomen: soft and non-tender without masses, organomegaly or hernias noted.  No guarding or rebound Lungs:  Normal respiratory effort, chest expands symmetrically. Lungs are clear to auscultation, no crackles or wheezes. Heart - Regular rate and rhythm.  No murmurs, gallops or rubs.    Skin:  Intact without suspicious lesions or rashes  UA noted  Assessments/Plans  See after visit summary for details of patient instuctions  Right flank pain Seems musculoskeletal.  No signs of pyleo or lung disease or shingles or intraabdominal disease.   Treat symptomatically and follow up if does not resolve

## 2018-08-21 ENCOUNTER — Ambulatory Visit (INDEPENDENT_AMBULATORY_CARE_PROVIDER_SITE_OTHER): Payer: BLUE CROSS/BLUE SHIELD

## 2018-08-21 DIAGNOSIS — Z23 Encounter for immunization: Secondary | ICD-10-CM | POA: Diagnosis not present

## 2018-12-18 ENCOUNTER — Ambulatory Visit: Payer: Self-pay | Admitting: Family Medicine

## 2019-06-30 ENCOUNTER — Other Ambulatory Visit: Payer: Self-pay

## 2019-06-30 ENCOUNTER — Ambulatory Visit (INDEPENDENT_AMBULATORY_CARE_PROVIDER_SITE_OTHER): Payer: BC Managed Care – PPO | Admitting: Family Medicine

## 2019-06-30 VITALS — BP 90/65 | HR 82 | Temp 98.4°F | Wt 153.6 lb

## 2019-06-30 DIAGNOSIS — Z0000 Encounter for general adult medical examination without abnormal findings: Secondary | ICD-10-CM | POA: Diagnosis not present

## 2019-06-30 DIAGNOSIS — Z23 Encounter for immunization: Secondary | ICD-10-CM | POA: Diagnosis not present

## 2019-06-30 DIAGNOSIS — G44219 Episodic tension-type headache, not intractable: Secondary | ICD-10-CM

## 2019-06-30 DIAGNOSIS — G44209 Tension-type headache, unspecified, not intractable: Secondary | ICD-10-CM

## 2019-06-30 LAB — POCT GLYCOSYLATED HEMOGLOBIN (HGB A1C): Hemoglobin A1C: 5 % (ref 4.0–5.6)

## 2019-06-30 NOTE — Patient Instructions (Signed)
It was great meeting you today!  I started her pressure headaches call but fortunately I feel like they are more annoying than anything.  They do not have any features that make them seem more serious.  Typically you can take Tylenol 325 mg every 4 hours as needed for the headaches.  I believe it is due to your dietary change.  This is a positive change and will likely be a little bit of weight but early on can cause headaches.  We are going to get a blood count and a diabetes test.  These results should be available in the next 1 to 2 days.  We will give you a call with results.  You also received a flu shot today.

## 2019-07-01 LAB — CBC WITH DIFFERENTIAL/PLATELET
Basophils Absolute: 0.1 10*3/uL (ref 0.0–0.2)
Basos: 1 %
EOS (ABSOLUTE): 0.5 10*3/uL — ABNORMAL HIGH (ref 0.0–0.4)
Eos: 6 %
Hematocrit: 35.8 % (ref 34.0–46.6)
Hemoglobin: 10.6 g/dL — ABNORMAL LOW (ref 11.1–15.9)
Immature Grans (Abs): 0.1 10*3/uL (ref 0.0–0.1)
Immature Granulocytes: 1 %
Lymphocytes Absolute: 2 10*3/uL (ref 0.7–3.1)
Lymphs: 22 %
MCH: 21.3 pg — ABNORMAL LOW (ref 26.6–33.0)
MCHC: 29.6 g/dL — ABNORMAL LOW (ref 31.5–35.7)
MCV: 72 fL — ABNORMAL LOW (ref 79–97)
Monocytes Absolute: 0.5 10*3/uL (ref 0.1–0.9)
Monocytes: 6 %
Neutrophils Absolute: 6.1 10*3/uL (ref 1.4–7.0)
Neutrophils: 64 %
Platelets: 286 10*3/uL (ref 150–450)
RBC: 4.97 x10E6/uL (ref 3.77–5.28)
RDW: 15.2 % (ref 11.7–15.4)
WBC: 9.3 10*3/uL (ref 3.4–10.8)

## 2019-07-05 NOTE — Progress Notes (Signed)
[place] in person interpreter utilized for entirety of exam  HPI 28 year old female who presents for physical.  Her only complaint is that she is having headaches daily.  States these headaches are usually frontal, bilateral headaches.  Typically occur before meals.  She has not changed jobs, sleep schedule, caffeine intake, or had any other significant changes to her life aside from she started a new diet a few weeks ago.  This is roughly the time of the headache started.  She has been trying to get a much more low-carb.  Headaches have improved somewhat recently but she is still having them quite frequently.  She will take anything for the headaches.  Patient received flu shot today. Patient denied pap at this visit.  CC: annual physical   ROS:   Review of Systems See HPI for ROS.   CC, SH/smoking status, and VS noted  Objective: BP 90/65   Pulse 82   Temp 98.4 F (36.9 C)   Wt 153 lb 9.6 oz (69.7 kg)   SpO2 99%   BMI 27.21 kg/m  Gen: 28 year old female, no acute distress, very pleasant HEENT: eomi, perrla, no cervical lymphadenopathy CV: rrr, no m/r/g Resp: lungs ctab, no accessory muscle use Abd: s, nt, nd. Neuro: cn 2-12 intact, no focal neuro deficit, 5/5 strength BUE, BLE, no gait abnormality   Assessment and plan:  Health care maintenance Received flu vaccine, deferred pap smear at patient request  Tension headache History consistent with tension type headaches. No alarm signs. Could be related to new low carbohydrate diet. Can take tylenol as needed for headache control. Follow up in 2-3 weeks if symptoms do not resolved.   Orders Placed This Encounter  Procedures  . Flu Vaccine QUAD 36+ mos IM  . CBC with Differential  . POCT glycosylated hemoglobin (Hb A1C)    Associate with Z13.1    No orders of the defined types were placed in this encounter.    Guadalupe Dawn MD PGY-3 Family Medicine Resident  07/06/2019 4:00 PM

## 2019-07-06 ENCOUNTER — Encounter: Payer: Self-pay | Admitting: Family Medicine

## 2019-07-06 DIAGNOSIS — G44209 Tension-type headache, unspecified, not intractable: Secondary | ICD-10-CM | POA: Insufficient documentation

## 2019-07-06 NOTE — Assessment & Plan Note (Signed)
Received flu vaccine, deferred pap smear at patient request

## 2019-07-06 NOTE — Assessment & Plan Note (Signed)
History consistent with tension type headaches. No alarm signs. Could be related to new low carbohydrate diet. Can take tylenol as needed for headache control. Follow up in 2-3 weeks if symptoms do not resolved.

## 2019-09-17 NOTE — L&D Delivery Note (Addendum)
LABOR COURSE 29 yo G3P2002 presenting to MAU in active labor. Patient was brought to the floor, AROM @ 0323 and patient progressed to complete shortly after.   Delivery Note Called to room and patient was complete and pushing. Head delivered LOA. Delivered through tight nuchal cord x2. Shoulder and body delivered in usual fashion. At 0323 a healthy female was delivered via Vaginal, Spontaneous (Presentation: LOA ).  Infant with spontaneous cry, placed on mother's abdomen, dried and stimulated. Cord clamped x 2 after 1-minute delay, and cut by FOB. Cord blood drawn. Placenta delivered spontaneously with gentle cord traction. Appears intact. Fundus firm with massage and Pitocin. Labia, perineum, vagina, and cervix inspected with 1st degree perineal laceration. Area was numbed with 10 cc of lidocaine. Repaired with 3-0 vicryl.  APGAR:7,9 ; weight pending.   Cord: 3VC, membranes intact.    Anesthesia: lidocaine 10cc Episiotomy: None Lacerations: 1st degree perineal laceration Suture Repair: 3.0 vicryl Est. Blood Loss (mL): 50  Mom to postpartum.  Baby to Couplet care / Skin to Skin.  Betti Cruz, MD PGY-1 08/17/2020 4:18 AM    GME ATTESTATION:  I saw and evaluated the patient. I agree with the findings and the plan of care as documented in the resident's note.  Alric Seton, MD OB Fellow, Faculty [facility], Center for [facility] 08/17/2020 7:30 AM

## 2019-12-22 ENCOUNTER — Ambulatory Visit: Payer: BC Managed Care – PPO | Admitting: Family Medicine

## 2019-12-22 ENCOUNTER — Other Ambulatory Visit: Payer: Self-pay

## 2019-12-22 VITALS — BP 115/75 | HR 96 | Ht 63.19 in | Wt 157.8 lb

## 2019-12-22 DIAGNOSIS — N926 Irregular menstruation, unspecified: Secondary | ICD-10-CM | POA: Diagnosis not present

## 2019-12-22 DIAGNOSIS — Z3491 Encounter for supervision of normal pregnancy, unspecified, first trimester: Secondary | ICD-10-CM | POA: Diagnosis not present

## 2019-12-22 LAB — POCT URINE PREGNANCY: Preg Test, Ur: POSITIVE — AB

## 2019-12-22 MED ORDER — PRENATAL 27-1 MG PO TABS: 1.0000 | ORAL_TABLET | Freq: Every day | ORAL | 0 refills | Status: DC

## 2019-12-22 NOTE — Patient Instructions (Signed)
It was great seeing you today!  Congratulations you are pregnant.  We will get a little bit of blood work today.  Please schedule a new OB appointment on your way out.  They will schedule you for an hour-long appointment which is way more in-depth.  I will give you a call with the results.

## 2019-12-23 LAB — OBSTETRIC PANEL, INCLUDING HIV
Antibody Screen: NEGATIVE
Basophils Absolute: 0.1 10*3/uL (ref 0.0–0.2)
Basos: 1 %
EOS (ABSOLUTE): 0.4 10*3/uL (ref 0.0–0.4)
Eos: 3 %
HIV Screen 4th Generation wRfx: NONREACTIVE
Hematocrit: 33.9 % — ABNORMAL LOW (ref 34.0–46.6)
Hemoglobin: 10.6 g/dL — ABNORMAL LOW (ref 11.1–15.9)
Hepatitis B Surface Ag: NEGATIVE
Immature Grans (Abs): 0 10*3/uL (ref 0.0–0.1)
Immature Granulocytes: 0 %
Lymphocytes Absolute: 1.6 10*3/uL (ref 0.7–3.1)
Lymphs: 16 %
MCH: 22.8 pg — ABNORMAL LOW (ref 26.6–33.0)
MCHC: 31.3 g/dL — ABNORMAL LOW (ref 31.5–35.7)
MCV: 73 fL — ABNORMAL LOW (ref 79–97)
Monocytes Absolute: 0.6 10*3/uL (ref 0.1–0.9)
Monocytes: 6 %
Neutrophils Absolute: 7.8 10*3/uL — ABNORMAL HIGH (ref 1.4–7.0)
Neutrophils: 74 %
Platelets: 328 10*3/uL (ref 150–450)
RBC: 4.65 x10E6/uL (ref 3.77–5.28)
RDW: 14.9 % (ref 11.7–15.4)
RPR Ser Ql: NONREACTIVE
Rh Factor: POSITIVE
Rubella Antibodies, IGG: 1.71 index (ref >0.99)
WBC: 10.5 10*3/uL (ref 3.4–10.8)

## 2019-12-25 ENCOUNTER — Encounter: Payer: Self-pay | Admitting: Family Medicine

## 2019-12-25 DIAGNOSIS — Z3493 Encounter for supervision of normal pregnancy, unspecified, third trimester: Secondary | ICD-10-CM | POA: Insufficient documentation

## 2019-12-25 DIAGNOSIS — Z3401 Encounter for supervision of normal first pregnancy, first trimester: Secondary | ICD-10-CM | POA: Insufficient documentation

## 2019-12-25 NOTE — Assessment & Plan Note (Signed)
First trimester pregnancy, estimated date 6 weeks 1 day on 4/7.  I offered patient Diclegis to assist her with her nausea which she declined.  I sent in prenatal vitamins to her pharmacy.  Patient scheduled for new OB appointment on 12/31/2019.  OB panel drawn at this visit was significant for hemoglobin of 10.6.  I did attempt to reach the patient to discuss starting iron supplementation, but received no answer.  At follow-up please ensure patient received her prenatal vitamin and discuss starting iron supplementation with her.

## 2019-12-25 NOTE — Progress Notes (Signed)
  In person montagnard interpreter utilized throughout entirety of exam  CHIEF COMPLAINT / HPI: Very pleasant 29 year old female presents for positive pregnancy test at home.  Has been having some mild nausea.  Last menstrual period was 11/14/2019, which ended on 11/16/2019.  This would put her estimated gestational age at 6 weeks 1 day on day of visit.  Urine pregnancy test was positive in the clinic. This is not a surprise pregnancy for the patient.  She is having no other symptoms.  PERTINENT  PMH / PSH: None   OBJECTIVE: BP 115/75   Pulse 96   Ht 5' 3.19" (1.605 m)   Wt 157 lb 12.8 oz (71.6 kg)   LMP 11/14/2019 (Exact Date)   SpO2 99%   BMI 27.79 kg/m   Gen: Very pleasant 29 year old female, no acute distress CV: Regular rate and rhythm, no M/R/G Resp: Lungs clear to auscultation bilaterally Neuro: Alert and oriented, Speech clear, No gross deficits   ASSESSMENT / PLAN:  First trimester pregnancy First trimester pregnancy, estimated date 6 weeks 1 day on 4/7.  I offered patient Diclegis to assist her with her nausea which she declined.  I sent in prenatal vitamins to her pharmacy.  Patient scheduled for new OB appointment on 12/31/2019.  OB panel drawn at this visit was significant for hemoglobin of 10.6.  I did attempt to reach the patient to discuss starting iron supplementation, but received no answer.  At follow-up please ensure patient received her prenatal vitamin and discuss starting iron supplementation with her.     Myrene Buddy MD PGY-3 Family Medicine Resident [facility]

## 2019-12-26 ENCOUNTER — Encounter: Payer: Self-pay | Admitting: Family Medicine

## 2019-12-31 ENCOUNTER — Other Ambulatory Visit (HOSPITAL_COMMUNITY)
Admission: RE | Admit: 2019-12-31 | Discharge: 2019-12-31 | Disposition: A | Discharge status: Home / Self Care | Payer: BC Managed Care – PPO | Source: Ambulatory Visit | Attending: Family Medicine | Admitting: Family Medicine

## 2019-12-31 ENCOUNTER — Ambulatory Visit: Payer: BC Managed Care – PPO | Admitting: Family Medicine

## 2019-12-31 ENCOUNTER — Other Ambulatory Visit: Payer: Self-pay

## 2019-12-31 ENCOUNTER — Encounter: Payer: Self-pay | Admitting: Family Medicine

## 2019-12-31 DIAGNOSIS — Z3401 Encounter for supervision of normal first pregnancy, first trimester: Secondary | ICD-10-CM

## 2019-12-31 NOTE — Progress Notes (Signed)
Patient Name: Sarah Day Date of Birth: 17-Feb-1991 [place] Initial Prenatal Visit  Lillyann Debord is a 29 y.o. year old G28P2002 at Unknown who presents for her initial prenatal visit. Pregnancy is planned She reports breast tenderness, fatigue, morning sickness, nausea and positive home pregnancy test. She is taking a prenatal vitamin.  She denies pelvic pain or vaginal bleeding.   Pregnancy Dating:  . The patient is dated by LMP. . LMP: 11/14/2019 . Period is certain:  Yes.  . Periods were regular:  Relatively. Around every 28 days.  Marland Kitchen LMP was a typical period:  Yes.  . Using hormonal contraception in 3 months prior to conception: No  Lab Review: . Blood type: A . Rh Status: + . Antibody screen: Negative . HIV: Negative . RPR: Negative . Hemoglobin electrophoresis reviewed: Ordered . Results of OB urine culture are: Not examined . Rubella: Immune . Varicella status is Unknown  PMH: Reviewed and as detailed below: . HTN: No  . Type 1 or 2 Diabetes: No  . Depression:  No  . Seizure disorder:  No . VTE: No ,  . History of STI No,  . Abnormal Pap smear:  No, . Genital herpes simplex:  No   PSH: . Gynecologic Surgery:  no . Surgical history reviewed, notable for: None   Obstetric History: . Obstetric history tab updated and reviewed.  . Summary of prior pregnancies: Uncomplicated  . Cesarean delivery: No  . Gestational Diabetes:  No . Hypertension in pregnancy: No . History of preterm birth: No . History of LGA/SGA infant:  No . History of shoulder dystocia: No . Indications for referral were reviewed, and the patient has no obstetric indications for referral to [facility] at this time.   Social History: . Partner's name: Bol Kpa   . Tobacco use: No . Alcohol use:  No . Other substance use:  No  Current Medications:  . Prenatal Vitamins, Tylenol PRN   . Reviewed and appropriate in pregnancy.   Genetic and Infection Screen: . Flow  Sheet Updated Yes  Prenatal Exam: Gen: Well nourished, well developed.  No distress.  Vitals noted. HEENT: Normocephalic, atraumatic.  Neck supple without cervical lymphadenopathy, thyromegaly or thyroid nodules.  Fair dentition. CV: RRR no murmur, gallops or rubs Lungs: CTA B.  Normal respiratory effort without wheezes or rales. Abd: soft, NTND. +BS.  Uterus not appreciated above pelvis. GU: Normal external female genitalia without lesions.  Nl vaginal, well rugated without lesions. No abnormal vaginal discharge.  Cervix is erythematous and friable.  Mild amount of bleeding with Pap smear. Ext: No clubbing, cyanosis or edema. Psych: Normal grooming and dress.  Not depressed or anxious appearing.  Normal thought content and process without flight of ideas or looseness of associations  Fetal heart tones: To early to detect at this time *  Assessment/Plan:  Bethel Ricardo is a 29 y.o. [id number] at Unknown who presents to initiate prenatal care. She is doing well.  Current pregnancy issues include none.  1. Routine prenatal care: Marland Kitchen As dating is reliable, a dating ultrasound has not been ordered. Dating tab updated. . Pre-pregnancy weight updated. Expected weight gain this pregnancy is 25-35 pounds  . Prenatal labs reviewed, notable for anemia.  Ferritin labs ordered.  Will prescribe iron if needed. . Indications for referral to [facility] were reviewed and the patient does not meet criteria for referral.  . Medication list reviewed and updated.  . Recommended patient see a dentist  for regular care.  . Bleeding and pain precautions reviewed. . Importance of prenatal vitamins reviewed.  . Genetic screening offered. Patient opted for: patient undecided, will address at future visit. . The patient will not be age 91 or over at time of delivery. Referral to genetic counseling was not offered today.  . The patient has the following risk factors for preexisting diabetes: Reviewed indications for early 1 hour  glucose testing, not indicated . An early 1 hour glucose tolerance test was not ordered. . Pregnancy Medical Home and PHQ-9 forms completed, problems noted: No    Follow up 4 weeks for next prenatal visit.

## 2019-12-31 NOTE — Patient Instructions (Addendum)
Is a pleasure to meet you today.  Congratulations on your pregnancy!  Today we collected few more labs which are routine prenatal labs.  We also completed a Pap smear and did routine OB STD tests.  With the Pap smear I irritated your cervix so you may notice some light spotting.  You should not see any large clots and if you do need to be seen by a doctor.  If you have any severe abdominal pain you also need to be evaluated by the doctor.  If you have any questions or concerns please call the clinic and we can schedule appointment.  I will call you with the results of the lab work and if I need to send iron supplementation and I will send it to your pharmacy.  I would like to see you back in 4 weeks for a routine OB visit.   First Trimester of Pregnancy  The first trimester of pregnancy is from week 1 until the end of week 13 (months 1 through 3). During this time, your baby will begin to develop inside you. At 6-8 weeks, the eyes and face are formed, and the heartbeat can be seen on ultrasound. At the end of 12 weeks, all the baby's organs are formed. Prenatal care is all the medical care you receive before the birth of your baby. Make sure you get good prenatal care and follow all of your doctor's instructions. Follow these instructions at home: Medicines  Take over-the-counter and prescription medicines only as told by your doctor. Some medicines are safe and some medicines are not safe during pregnancy.  Take a prenatal vitamin that contains at least 600 micrograms (mcg) of folic acid.  If you have trouble pooping (constipation), take medicine that will make your stool soft (stool softener) if your doctor approves. Eating and drinking   Eat regular, healthy meals.  Your doctor will tell you the amount of weight gain that is right for you.  Avoid raw meat and uncooked cheese.  If you feel sick to your stomach (nauseous) or throw up (vomit): ? Eat 4 or 5 small meals a day instead of 3 large  meals. ? Try eating a few soda crackers. ? Drink liquids between meals instead of during meals.  To prevent constipation: ? Eat foods that are high in fiber, like fresh fruits and vegetables, whole grains, and beans. ? Drink enough fluids to keep your pee (urine) clear or pale yellow. Activity  Exercise only as told by your doctor. Stop exercising if you have cramps or pain in your lower belly (abdomen) or low back.  Do not exercise if it is too hot, too humid, or if you are in a place of great height (high altitude).  Try to avoid standing for long periods of time. Move your legs often if you must stand in one place for a long time.  Avoid heavy lifting.  Wear low-heeled shoes. Sit and stand up straight.  You can have sex unless your doctor tells you not to. Relieving pain and discomfort  Wear a good support bra if your breasts are sore.  Take warm water baths (sitz baths) to soothe pain or discomfort caused by hemorrhoids. Use hemorrhoid cream if your doctor says it is okay.  Rest with your legs raised if you have leg cramps or low back pain.  If you have puffy, bulging veins (varicose veins) in your legs: ? Wear support hose or compression stockings as told by your doctor. ? Raise (  elevate) your feet for 15 minutes, 3-4 times a day. ? Limit salt in your food. Prenatal care  Schedule your prenatal visits by the twelfth week of pregnancy.  Write down your questions. Take them to your prenatal visits.  Keep all your prenatal visits as told by your doctor. This is important. Safety  Wear your seat belt at all times when driving.  Make a list of emergency phone numbers. The list should include numbers for family, friends, the hospital, and police and fire departments. General instructions  Ask your doctor for a referral to a local prenatal class. Begin classes no later than at the start of month 6 of your pregnancy.  Ask for help if you need counseling or if you need  help with nutrition. Your doctor can give you advice or tell you where to go for help.  Do not use hot tubs, steam rooms, or saunas.  Do not douche or use tampons or scented sanitary pads.  Do not cross your legs for long periods of time.  Avoid all herbs and alcohol. Avoid drugs that are not approved by your doctor.  Do not use any tobacco products, including cigarettes, chewing tobacco, and electronic cigarettes. If you need help quitting, ask your doctor. You may get counseling or other support to help you quit.  Avoid cat litter boxes and soil used by cats. These carry germs that can cause birth defects in the baby and can cause a loss of your baby (miscarriage) or stillbirth.  Visit your dentist. At home, brush your teeth with a soft toothbrush. Be gentle when you floss. Contact a doctor if:  You are dizzy.  You have mild cramps or pressure in your lower belly.  You have a nagging pain in your belly area.  You continue to feel sick to your stomach, you throw up, or you have watery poop (diarrhea).  You have a bad smelling fluid coming from your vagina.  You have pain when you pee (urinate).  You have increased puffiness (swelling) in your face, hands, legs, or ankles. Get help right away if:  You have a fever.  You are leaking fluid from your vagina.  You have spotting or bleeding from your vagina.  You have very bad belly cramping or pain.  You gain or lose weight rapidly.  You throw up blood. It may look like coffee grounds.  You are around people who have [place] measles, fifth disease, or chickenpox.  You have a very bad headache.  You have shortness of breath.  You have any kind of trauma, such as from a fall or a car accident. Summary  The first trimester of pregnancy is from week 1 until the end of week 13 (months 1 through 3).  To take care of yourself and your unborn baby, you will need to eat healthy meals, take medicines only if your doctor tells  you to do so, and do activities that are safe for you and your baby.  Keep all follow-up visits as told by your doctor. This is important as your doctor will have to ensure that your baby is healthy and growing well. This information is not intended to replace advice given to you by your health care provider. Make sure you discuss any questions you have with your health care provider. Document Revised: 12/24/2018 Document Reviewed: 09/10/2016 Elsevier Patient Education  2020 [employer].

## 2020-01-02 LAB — CULTURE, OB URINE

## 2020-01-02 LAB — URINE CULTURE, OB REFLEX: Organism ID, Bacteria: NO GROWTH

## 2020-01-04 LAB — CYTOLOGY - PAP
Chlamydia: NEGATIVE
Comment: NEGATIVE
Comment: NEGATIVE
Comment: NORMAL
Diagnosis: NEGATIVE
High risk HPV: NEGATIVE
Neisseria Gonorrhea: NEGATIVE

## 2020-01-04 LAB — HGB FRACTIONATION CASCADE: Hgb A2: 1.9 % (ref 1.8–3.2)

## 2020-01-04 LAB — HGB FRACTIONATION BY HPLC
Hgb A: 94.6 % — ABNORMAL LOW (ref 96.4–98.8)
Hgb C: 0 %
Hgb E: 0 %
Hgb F: 2.3 % — ABNORMAL HIGH (ref 0.0–2.0)
Hgb S: 0 %
Hgb Variant: 1.2 % — ABNORMAL HIGH

## 2020-01-04 LAB — FERRITIN: Ferritin: 97 ng/mL (ref 15–150)

## 2020-01-12 ENCOUNTER — Telehealth: Payer: Self-pay | Admitting: Family Medicine

## 2020-01-12 NOTE — Telephone Encounter (Signed)
Spoke with hematology oncology Dr. Leonides Schanz regarding patient's fractionated hemoglobin levels.  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

## 2020-01-25 ENCOUNTER — Encounter: Payer: BC Managed Care – PPO | Admitting: Family Medicine

## 2020-01-25 NOTE — Progress Notes (Deleted)
.  fmcob

## 2020-02-03 ENCOUNTER — Other Ambulatory Visit: Payer: Self-pay

## 2020-02-03 ENCOUNTER — Ambulatory Visit (INDEPENDENT_AMBULATORY_CARE_PROVIDER_SITE_OTHER): Payer: BC Managed Care – PPO | Admitting: Family Medicine

## 2020-02-03 VITALS — BP 102/56 | HR 90 | Wt 155.2 lb

## 2020-02-03 DIAGNOSIS — Z3401 Encounter for supervision of normal first pregnancy, first trimester: Secondary | ICD-10-CM

## 2020-02-03 MED ORDER — TYLENOL 325 MG PO CAPS: 1.0000 | ORAL_CAPSULE | ORAL | 0 refills | Status: DC | PRN

## 2020-02-03 NOTE — Patient Instructions (Addendum)
It was great seeing you today!  Everything looks good with the baby's heartbeat.  You can take Tylenol 325 mg every 4 hours as needed for your back pain and headache.  This is essentially the only medication he can take for your pain during pregnancy.  You can use over-the-counter topical patches, I gave you a handout for these.  We will see you back in around 5 weeks for genetic screening and if you set up with an anatomy ultrasound.  Th?t vui khi g?p b?n hm nay! M?i th? c v? t?t v?i nh?p tim c?a em b. B?n c th? dng Tylenol 325 mg c? 4 gi? m?t l?n n?u c?n ??i v?i ch?ng ?au l?ng v nh?c ??u. V? c? b?n, ?y l lo?i thu?c duy nh?t m anh ?y c th? dng ?? gi?m ?au cho b?n khi mang [place]. B?n c th? s? d?ng cc mi?ng dn t?i ch? khng k ??n, ti ? cho b?n m?t ti li?u pht cho nh?ng th? ny. Chng ti s? h?n g?p b?n tr? l?i sau kho?ng 5 tu?n ?? ki?m tra di truy?n v n?u b?n chu?n b? cho m?t cu?c Prine m gi?i ph?u.

## 2020-02-04 NOTE — Progress Notes (Signed)
  Patient Name: [name] Date of Birth: [birth year] [facility] Prenatal Visit [place] interpreter [place], #[id number] utilize throughout entirety of exam and visit  Sarah Day is a 29 y.o. [id number] at [redacted]w[redacted]d here for routine follow up. She is dated by LMP.  She reports backache and headache.  She denies vaginal bleeding.  Patient states that she has used a little bit of Tylenol for the headaches and the backache.  Presents that she took Tylenol one time and states that it worked a little bit, but did not completely resolve her pain.  Eventually both these problems do resolve on their own.  The back pain is located her lower back.  The headaches are consistent with a tension type headaches that she has chronically.  I reviewed that topical liniments such as lidocaine or Salonpas are helpful for the back pain.  Tylenol is effective for both headache and back pain.  I reviewed that if she is unsure if an over-the-counter medication is safe for her baby to please contact us, but in general Tylenol is going to be the only option for both these problems.  Vitals:   02/03/20 0939  BP: (!) 102/56  Pulse: 90  General: Well-appearing, no acute distress, very pleasant Respiratory: Lungs clear to auscultation bilaterally, no accessory muscle use, no distress Cardiac: Regular rate rhythm, no M/R/G Neuro: No focal neurologic deficits appreciated, no gait abnormality OB: BP ranging from 131 to 137 bpm, able to locate after some effort, partial obscured by pubic bone  A/P: Pregnancy at [redacted]w[redacted]d.  Doing well.  Some back pain and having some trouble from her tension type headaches.  Patient can take Tylenol for both these issues, I reviewed that a safe dose is 325 mg every 4 hours as needed and that she is not to exceed this amount.  Reviewed that if she is on sure about medications that she can take, to please contact us for recommendations.  1. Routine Prenatal Care:  Marland Kitchen Dating reviewed, dating tab is  correct . Fetal heart tones Appropriate . Influenza vaccine not administered as not influenza season.   . The patient has the following indication for screening preexisting diabetes: Reviewed indications for early 1 hour glucose testing, not indicated . Marland Kitchen Anatomy ultrasound ordered to be scheduled at 18-20 weeks. . Patient is interested in genetic screening.   She will follow up at 16 weeks for quad screen. . Pregnancy education including expected weight gain in pregnancy, OTC medication use, continued use of prenatal vitamin, smoking cessation if applicable, and nutrition in pregnancy.   . Bleeding and pain precautions reviewed.  Follow up at 16 weeks  Myrene Buddy MD PGY-3 Family Medicine Resident

## 2020-02-23 ENCOUNTER — Other Ambulatory Visit: Payer: Self-pay

## 2020-02-23 ENCOUNTER — Ambulatory Visit (INDEPENDENT_AMBULATORY_CARE_PROVIDER_SITE_OTHER): Payer: BC Managed Care – PPO | Admitting: Family Medicine

## 2020-02-23 DIAGNOSIS — O219 Vomiting of pregnancy, unspecified: Secondary | ICD-10-CM | POA: Diagnosis not present

## 2020-02-23 DIAGNOSIS — K219 Gastro-esophageal reflux disease without esophagitis: Secondary | ICD-10-CM | POA: Insufficient documentation

## 2020-02-23 MED ORDER — CALCIUM CARBONATE 1250 (500 CA) MG PO CHEW: 1.0000 | CHEWABLE_TABLET | Freq: Three times a day (TID) | ORAL | 3 refills | Status: DC

## 2020-02-23 MED ORDER — DOXYLAMINE-PYRIDOXINE 10-10 MG PO TBEC: DELAYED_RELEASE_TABLET | ORAL | 1 refills | Status: DC

## 2020-02-23 NOTE — Assessment & Plan Note (Addendum)
Normal abd exam. Will trial diclegis.

## 2020-02-23 NOTE — Patient Instructions (Addendum)
**  If translating, I speak Estanislado Spire**  It was great to see you!  Our plans for today:  - Take doxylamine-pyridoxine for nausea. Take two tablets at bedtime on day 1 and 2; if symptoms persist, take 1 tablet in morning and 2 tablets at bedtime on day 3; if symptoms persist, may increase to 1 tablet in morning, 1 tablet mid-afternoon, and 2 tablets at bedtime on day 4. - Take Tums as needed with meals for acid reflux. Eat smaller, more frequent meals to reduce this.   Take care and seek immediate care sooner if you develop any concerns.   Dr. Mollie [facility]

## 2020-02-23 NOTE — Progress Notes (Signed)
    SUBJECTIVE:   CHIEF COMPLAINT / HPI:   Endorsing 1 week history of epigastric abdominal pain noted after eating with radiation to central chest. Pain is burning.  No specific food triggers identified. No pain in the absence of eating. Does have nausea but has been ongoing since start of pregnancy. Has some loose stool, 3 BMs in the last 24 hours. Denies vomiting, constipation, blood in stool, fevers, dysuria.  Has not tried any medications given current pregnancy.  Denies vaginal bleeding, cramping, leakage of fluid.  Is 14 weeks with sure dating, has prenatal f/u scheduled.  PERTINENT  PMH / PSH: current pregnancy  OBJECTIVE:   BP 104/60   Pulse 84   Ht 5\' 3"  (1.6 m)   Wt 153 lb (69.4 kg)   LMP 11/14/2019 (Exact Date)   SpO2 98%   BMI 27.10 kg/m   Gen: well appearing, in NAD Cardiac: RRR, no murmur Abd: soft, NTND, +BS. Fundus appreciated above pelvis.  ASSESSMENT/PLAN:   Acid reflux Symptoms most consistent with acid reflux given character, location of pain and post-prandial. Likely 2/2 increased abdominal pressure given current pregnancy. Will trial Tums. Can consider initiation of H2 blocker if not sufficient.  Vomiting or nausea of pregnancy Normal abd exam. Will trial diclegis.     11/16/2019, DO [place] [facility]

## 2020-02-23 NOTE — Assessment & Plan Note (Signed)
Symptoms most consistent with acid reflux given character, location of pain and post-prandial. Likely 2/2 increased abdominal pressure given current pregnancy. Will trial Tums. Can consider initiation of H2 blocker if not sufficient.

## 2020-02-25 ENCOUNTER — Telehealth: Payer: Self-pay

## 2020-02-25 NOTE — Telephone Encounter (Signed)
Received fax from pharmacy, PA needed on doxylamine-pyridoxine. However, per pharmacy she does not appear to have active insurance. I attempted to use all the information given by pharmacist to locate her on covermymeds, however "member not found." Unsure if patient has another form of insurance. I attempted to contact patient, however no answer or option for VM.

## 2020-03-09 ENCOUNTER — Encounter: Payer: Self-pay | Admitting: Family Medicine

## 2020-03-09 ENCOUNTER — Ambulatory Visit: Payer: BC Managed Care – PPO | Admitting: Family Medicine

## 2020-03-09 ENCOUNTER — Other Ambulatory Visit: Payer: Self-pay

## 2020-03-09 VITALS — BP 110/68 | HR 96 | Wt 154.0 lb

## 2020-03-09 DIAGNOSIS — Z3492 Encounter for supervision of normal pregnancy, unspecified, second trimester: Secondary | ICD-10-CM

## 2020-03-09 MED ORDER — PRENATAL 27-1 MG PO TABS: 1.0000 | ORAL_TABLET | Freq: Every day | ORAL | 0 refills | Status: DC

## 2020-03-09 NOTE — Patient Instructions (Addendum)
Was great seeing you again today! Everything sounds good with the baby's heart and your measurement. We get you set up with a full anatomy ultrasound. We also drew blood for a genetic screening test. We will see you back in around 4 weeks, they will get you set up with your times a day at the front desk. Since I am leaving soon I will have you see our faculty OB practice so that you can better transition to your new provider.  Th?t tuy?t khi g?p l?i b?n hm nay! M?i th? ??u t?t v?i tri tim c?a em b v s? ?o l??ng c?a b?n. Chng ti gip b?n thi?t l?p m?t Ryner m gi?i ph?u ??y ??Esmeralda Arthur ti c?ng ? l?y mu ?? lm xt nghi?m sng l?c gen. Chng ti s? g?p l?i b?n sau kho?ng 4 tu?n, h? s? gip b?n s?p x?p th?i gian c?a b?n m?t ngy t?i qu?y l? tn. V ti s? s?m r?i ?i, ti s? cho b?n xem th?c hnh OB c?a khoa c?a chng ti ?? b?n c th? chuy?n ??i t?t h?n sang nh cung c?p d?ch v? m?i c?a mnh.

## 2020-03-10 NOTE — Progress Notes (Signed)
  Patient Name: [name] Date of Birth: 19-May-1991 [facility] Prenatal Visit  Sarah Day is a 29 y.o. [id number] at [redacted]w[redacted]d here for routine follow up. She is dated by LMP.  She reports nausea.  She denies vaginal bleeding.  See flow sheet for details.  She states that her nausea is very mild.  She was prescribed Diclegis in the past but there was a problem with getting it from the pharmacy.  Otherwise she states that she is doing very well.  She was interested in quad screen at last appointment and is coming in for that.  She also needs to be scheduled for her anatomy ultrasound.  Vitals:   03/09/20 0939  BP: 110/68  Pulse: 96  OB exam FHT: Between 143 and 151 16 cm fundal height   A/P: Pregnancy at [redacted]w[redacted]d.  Doing well.  No issue at this time.  Quad screen drawn at end of visit.  Scheduled for anatomy ultrasound on 03/30/2020.  1. Routine Prenatal Care:  Marland Kitchen Dating reviewed, dating tab is correct . Fetal heart tones Appropriate . Influenza vaccine not administered as not influenza season.   . The patient has the following indication for screening preexisting diabetes: Reviewed indications for early 1 hour glucose testing, not indicated . Marland Kitchen Anatomy ultrasound ordered to be scheduled at 18-20 weeks. . Patient is interested in genetic screening. As she is past 13 weeks and 6 days, a Quad screen  was offered.  . Pregnancy education including expected weight gain in pregnancy, OTC medication use, continued use of prenatal vitamin, smoking cessation if applicable, and nutrition in pregnancy.   . Bleeding and pain precautions reviewed.  2. Pregnancy issues include the following and were addressed as appropriate today:  . Nausea.  Patient states that her symptoms have become very mild at this point is not interested in treatment. . Follow-up scheduled with OB clinic in second trimester at this appointment . Anatomy ultrasound scheduled . Problem list  and pregnancy box updated: Yes.    Follow up 4 weeks.

## 2020-03-30 ENCOUNTER — Other Ambulatory Visit: Payer: Self-pay | Admitting: *Deleted

## 2020-03-30 ENCOUNTER — Ambulatory Visit: Payer: BC Managed Care – PPO | Attending: Obstetrics and Gynecology

## 2020-03-30 ENCOUNTER — Other Ambulatory Visit: Payer: Self-pay | Admitting: Family Medicine

## 2020-03-30 ENCOUNTER — Other Ambulatory Visit: Payer: Self-pay

## 2020-03-30 DIAGNOSIS — Z3A19 19 weeks gestation of pregnancy: Secondary | ICD-10-CM | POA: Diagnosis not present

## 2020-03-30 DIAGNOSIS — Z3492 Encounter for supervision of normal pregnancy, unspecified, second trimester: Secondary | ICD-10-CM

## 2020-03-30 DIAGNOSIS — Z363 Encounter for antenatal screening for malformations: Secondary | ICD-10-CM | POA: Diagnosis not present

## 2020-03-30 DIAGNOSIS — Z362 Encounter for other antenatal screening follow-up: Secondary | ICD-10-CM

## 2020-04-27 ENCOUNTER — Other Ambulatory Visit: Payer: Self-pay

## 2020-04-27 ENCOUNTER — Ambulatory Visit (INDEPENDENT_AMBULATORY_CARE_PROVIDER_SITE_OTHER): Payer: Self-pay | Admitting: Family Medicine

## 2020-04-27 DIAGNOSIS — Z3401 Encounter for supervision of normal first pregnancy, first trimester: Secondary | ICD-10-CM

## 2020-04-27 NOTE — Progress Notes (Signed)
° °  [facility] Visit  Sarah Day is a 29 y.o. [phone] at [redacted]w[redacted]d (via LMP = 19w u/s ) who presents to [facility] for routine follow up. Seen today along with medical student Sarah Day. Prenatal course, history, notes, ultrasounds, and laboratory results reviewed. [place] Sp[place] interpreter utilized today.  Denies cramping/ctx, fluid leaking, vaginal bleeding, or decreased fetal movement. Taking PNV.    Primary Prenatal Care Provider: Dr. Nobie Putnam  Postpartum Plans: - delivery planning: plans for vaginal deliver, unsure if epidural - circumcision: n/a, girl - feeding: breast & formula - pediatrician: [facility] - contraception: unsure, possibly POP  FHR: 155 bpm Uterine size: 24cm  Assessment & Plan  1. Routine prenatal care: - has f/u anatomy ultrasound scheduled for tomorrow given incomplete views - continue PNV - has not had hep C testing (recent change in guidelines) - recommend getting this with next lab draw at next visit - recommended and offered COVID vaccination to patient, but she prefers to wait until after delivery. Her family is all vaccinated. Counseled extensively on ways to protect herself given current COVID surge in our community. - reviewed labor & fetal movement precautions - follow up in 4 weeks for next visit  2. Bilateral choroid plexus cysts - had normal quad screen.  - Nothing specific to do for this, discussed with patient today.   3. Unknown immune status for hepatitis B and varicella - recommend titers for varicella and HbSAb with lab draw at next visit  Next prenatal visit in 4 weeks.  At that visit will need CBC, HIV, RPR, hep C, varicella IgG, HbSAb, Tdap, and 1hr GTT.  Levert Feinstein, MD [facility]

## 2020-04-27 NOTE — Patient Instructions (Signed)
Go to the MAU at [facility] at [facility] if:  You have cramping/contractions that do not go away with drinking water  Your water breaks.  Sometimes it is a big gush of fluid, sometimes it is just a trickle that keeps getting your underwear wet or running down your legs  You have vaginal bleeding.     You do not feel your baby moving like normal.  If you do not, get something to eat and drink and lay down and focus on feeling your baby move. If your baby is still not moving like normal, you should go to MAU.     Second Trimester of Pregnancy  The second trimester is from week 14 through week 27 (month 4 through 6). This is often the time in pregnancy that you feel your best. Often times, morning sickness has lessened or quit. You may have more energy, and you may get hungry more often. Your unborn baby is growing rapidly. At the end of the sixth month, he or she is about 9 inches long and weighs about 1 pounds. You will likely feel the baby move between 18 and 20 weeks of pregnancy. Follow these instructions at home: Medicines  Take over-the-counter and prescription medicines only as told by your doctor. Some medicines are safe and some medicines are not safe during pregnancy.  Take a prenatal vitamin that contains at least 600 micrograms (mcg) of folic acid.  If you have trouble pooping (constipation), take medicine that will make your stool soft (stool softener) if your doctor approves. Eating and drinking   Eat regular, healthy meals.  Avoid raw meat and uncooked cheese.  If you get low calcium from the food you eat, talk to your doctor about taking a daily calcium supplement.  Avoid foods that are high in fat and sugars, such as fried and sweet foods.  If you feel sick to your stomach (nauseous) or throw up (vomit): ? Eat 4 or 5 small meals a day instead of 3 large meals. ? Try eating a few soda crackers. ? Drink liquids between meals instead of during  meals.  To prevent constipation: ? Eat foods that are high in fiber, like fresh fruits and vegetables, whole grains, and beans. ? Drink enough fluids to keep your pee (urine) clear or pale yellow. Activity  Exercise only as told by your doctor. Stop exercising if you start to have cramps.  Do not exercise if it is too hot, too humid, or if you are in a place of great height (high altitude).  Avoid heavy lifting.  Wear low-heeled shoes. Sit and stand up straight.  You can continue to have sex unless your doctor tells you not to. Relieving pain and discomfort  Wear a good support bra if your breasts are tender.  Take warm water baths (sitz baths) to soothe pain or discomfort caused by hemorrhoids. Use hemorrhoid cream if your doctor approves.  Rest with your legs raised if you have leg cramps or low back pain.  If you develop puffy, bulging veins (varicose veins) in your legs: ? Wear support hose or compression stockings as told by your doctor. ? Raise (elevate) your feet for 15 minutes, 3-4 times a day. ? Limit salt in your food. Prenatal care  Write down your questions. Take them to your prenatal visits.  Keep all your prenatal visits as told by your doctor. This is important. Safety  Wear your seat belt when driving.  Make a list  phone numbers, including numbers for family, friends, the hospital, and police and fire departments. General instructions  Ask your doctor about the right foods to eat or for help finding a counselor, if you need these services.  Ask your doctor about local prenatal classes. Begin classes before month 6 of your pregnancy.  Do not use hot tubs, steam rooms, or saunas.  Do not douche or use tampons or scented sanitary pads.  Do not cross your legs for long periods of time.  Visit your dentist if you have not done so. Use a soft toothbrush to brush your teeth. Floss gently.  Avoid all smoking, herbs, and alcohol. Avoid drugs  that are not approved by your doctor.  Do not use any products that contain nicotine or tobacco, such as cigarettes and e-cigarettes. If you need help quitting, ask your doctor.  Avoid cat litter boxes and soil used by cats. These carry germs that can cause birth defects in the baby and can cause a loss of your baby (miscarriage) or stillbirth. Contact a doctor if:  You have mild cramps or pressure in your lower belly.  You have pain when you pee (urinate).  You have bad smelling fluid coming from your vagina.  You continue to feel sick to your stomach (nauseous), throw up (vomit), or have watery poop (diarrhea).  You have a nagging pain in your belly area.  You feel dizzy. Get help right away if:  You have a fever.  You are leaking fluid from your vagina.  You have spotting or bleeding from your vagina.  You have severe belly cramping or pain.  You lose or gain weight rapidly.  You have trouble catching your breath and have chest pain.  You notice sudden or extreme puffiness (swelling) of your face, hands, ankles, feet, or legs.  You have not felt the baby move in over an hour.  You have severe headaches that do not go away when you take medicine.  You have trouble seeing. Summary  The second trimester is from week 14 through week 27 (months 4 through 6). This is often the time in pregnancy that you feel your best.  To take care of yourself and your unborn baby, you will need to eat healthy meals, take medicines only if your doctor tells you to do so, and do activities that are safe for you and your baby.  Call your doctor if you get sick or if you notice anything unusual about your pregnancy. Also, call your doctor if you need help with the right food to eat, or if you want to know what activities are safe for you. This information is not intended to replace advice given to you by your health care provider. Make sure you discuss any questions you have with your health  care provider. Document Revised: 12/25/2018 Document Reviewed: 10/08/2016 Elsevier Patient Education  2020 Elsevier Inc.  

## 2020-04-28 ENCOUNTER — Ambulatory Visit: Payer: 59 | Attending: Obstetrics and Gynecology

## 2020-04-28 DIAGNOSIS — O358XX0 Maternal care for other (suspected) fetal abnormality and damage, not applicable or unspecified: Secondary | ICD-10-CM | POA: Diagnosis not present

## 2020-04-28 DIAGNOSIS — Z3A23 23 weeks gestation of pregnancy: Secondary | ICD-10-CM | POA: Diagnosis not present

## 2020-04-28 DIAGNOSIS — Z362 Encounter for other antenatal screening follow-up: Secondary | ICD-10-CM | POA: Diagnosis not present

## 2020-05-30 ENCOUNTER — Ambulatory Visit (INDEPENDENT_AMBULATORY_CARE_PROVIDER_SITE_OTHER): Payer: 59 | Admitting: Family Medicine

## 2020-05-30 ENCOUNTER — Other Ambulatory Visit: Payer: Self-pay

## 2020-05-30 VITALS — BP 98/62 | HR 82 | Wt 156.2 lb

## 2020-05-30 DIAGNOSIS — Z23 Encounter for immunization: Secondary | ICD-10-CM

## 2020-05-30 DIAGNOSIS — Z3483 Encounter for supervision of other normal pregnancy, third trimester: Secondary | ICD-10-CM

## 2020-05-30 LAB — POCT 1 HR PRENATAL GLUCOSE: Glucose 1 Hr Prenatal, POC: 156 mg/dL

## 2020-05-30 NOTE — Addendum Note (Signed)
Addended by: Jennette Bill on: 05/30/2020 10:16 AM   Modules accepted: Orders

## 2020-05-30 NOTE — Patient Instructions (Addendum)
Go to the MAU at [facility] at [facility] if:  You have cramping/contractions that do not go away with drinking water  Your water breaks.  Sometimes it is a big gush of fluid, sometimes it is just a trickle that keeps getting your underwear wet or running down your legs  You have vaginal bleeding.     You do not feel your baby moving like normal.  If you do not, get something to eat and drink and lay down and focus on feeling your baby move. If your baby is still not moving like normal, you should go to MAU.   Next visit in 2 weeks  Be well, Dr. Pollie Meyer

## 2020-05-30 NOTE — Progress Notes (Signed)
   [facility] Routine Prenatal Clinic Visit  Sarah Day is a 29 y.o. [id number] at [redacted]w[redacted]d (via LMP = 19w u/s ) who presents to for routine follow up. Seen today along with medical student Hyman Bible.  In-person [place] interpreter utilized today.  Denies Day, Sarah Day, Sarah Day, Sarah Day Putnam  FHR: 138 bpm Uterine size: 29cm  Assessment & Plan  1. Routine prenatal care: - 1 hour GTT today - labs today: CBC, HIV, RPR, varicella, Hep BSAb, hep C - continue PNV - long discussion today about COVID vaccine, strongly recommended patient get vaccinated, reviewed recommendations from professional societies suggesting all pregnant women be vaccinated. Patient declines vaccine today, prefers to wait until next visit to get COVID vaccine. She opts for flu and Tdap today.  - reviewed labor & fetal movement precautions - follow up in 2 weeks for next visit  2. Bilateral choroid plexus cysts - had normal quad screen.  - follow up anatomy scan was normal (CPC still seen but considered normal variant in light of normal quad screen)  3. Unknown immune status for hepatitis B and varicella - titers today  Next prenatal visit in 2 weeks.  At that visit will need COVID vaccine.  Levert Feinstein, MD [facility] Family Medicine Faculty

## 2020-05-31 LAB — HCV AB W REFLEX TO QUANT PCR: HCV Ab: <0.1 s/co ratio (ref 0.0–0.9)

## 2020-05-31 LAB — HEPATITIS B SURFACE ANTIBODY, QUANTITATIVE: Hepatitis B Surf Ab Quant: 15.1 m[IU]/mL (ref >9.9)

## 2020-05-31 LAB — CBC
Hematocrit: 28.8 % — ABNORMAL LOW (ref 34.0–46.6)
Hemoglobin: 8.7 g/dL — ABNORMAL LOW (ref 11.1–15.9)
MCH: 23.2 pg — ABNORMAL LOW (ref 26.6–33.0)
MCHC: 30.2 g/dL — ABNORMAL LOW (ref 31.5–35.7)
MCV: 77 fL — ABNORMAL LOW (ref 79–97)
Platelets: 214 10*3/uL (ref 150–450)
RBC: 3.75 x10E6/uL — ABNORMAL LOW (ref 3.77–5.28)
RDW: 14.4 % (ref 11.7–15.4)
WBC: 9.1 10*3/uL (ref 3.4–10.8)

## 2020-05-31 LAB — HCV INTERPRETATION

## 2020-05-31 LAB — VARICELLA ZOSTER ANTIBODY, IGG: Varicella zoster IgG: 846 index (ref >165)

## 2020-05-31 LAB — RPR: RPR Ser Ql: NONREACTIVE

## 2020-05-31 LAB — HIV ANTIBODY (ROUTINE TESTING W REFLEX): HIV Screen 4th Generation wRfx: NONREACTIVE

## 2020-06-02 ENCOUNTER — Other Ambulatory Visit (INDEPENDENT_AMBULATORY_CARE_PROVIDER_SITE_OTHER): Payer: 59

## 2020-06-02 ENCOUNTER — Other Ambulatory Visit: Payer: Self-pay

## 2020-06-02 DIAGNOSIS — Z3483 Encounter for supervision of other normal pregnancy, third trimester: Secondary | ICD-10-CM

## 2020-06-02 LAB — POCT CBG (FASTING - GLUCOSE)-MANUAL ENTRY: Glucose Fasting, POC: 99 mg/dL (ref 70–99)

## 2020-06-03 LAB — GESTATIONAL GLUCOSE TOLERANCE
Glucose, Fasting: 76 mg/dL (ref 65–94)
Glucose, GTT - 1 Hour: 182 mg/dL — ABNORMAL HIGH (ref 65–179)
Glucose, GTT - 2 Hour: 147 mg/dL (ref 65–154)
Glucose, GTT - 3 Hour: 132 mg/dL (ref 65–139)

## 2020-06-05 ENCOUNTER — Telehealth: Payer: Self-pay | Admitting: Family Medicine

## 2020-06-05 NOTE — Telephone Encounter (Signed)
Attempted to reach patient using phone interpreter, but unable to reach her as no interpreter was available for her language Sarah Day).  Hgb was low, recommend drawing ferritin at next visit to confirm iron deficiency, as this may also just be a manifestation of her known Hemoglobin [place].  Rest of labs good - varicella immune, Hep B immune, hep C negative, RPR and HIV negative, 3hr GTT normal.  Routing to Dr. Salvadore Dom, who is scheduled to see patient next, so she can discuss with patient at next visit.  Latrelle Dodrill, MD

## 2020-06-14 ENCOUNTER — Ambulatory Visit (INDEPENDENT_AMBULATORY_CARE_PROVIDER_SITE_OTHER): Payer: Medicaid Other | Admitting: Family Medicine

## 2020-06-14 ENCOUNTER — Other Ambulatory Visit: Payer: Self-pay

## 2020-06-14 VITALS — BP 110/80 | HR 84 | Wt 154.4 lb

## 2020-06-14 DIAGNOSIS — O99013 Anemia complicating pregnancy, third trimester: Secondary | ICD-10-CM

## 2020-06-14 DIAGNOSIS — Z3493 Encounter for supervision of normal pregnancy, unspecified, third trimester: Secondary | ICD-10-CM

## 2020-06-14 DIAGNOSIS — Z23 Encounter for immunization: Secondary | ICD-10-CM

## 2020-06-14 MED ORDER — FERROUS SULFATE 325 (65 FE) MG PO TABS: 325.0000 mg | ORAL_TABLET | Freq: Every day | ORAL | 2 refills | Status: DC

## 2020-06-14 NOTE — Patient Instructions (Signed)
It was wonderful to see you today.  Today we talked about:  Your blood levels. They are low. We are doing additional test and will notify you if they are abnormal. For now start taking a iron supplement with your prenatal vitamins.  Go to the MAU at [facility] at [facility] if:  You have cramping/contractions that do not go away with drinking water  Your water breaks. Sometimes it is a big gush of fluid, sometimes it is just a trickle that keeps getting your underwear wet or running down your legs  You have vaginal bleeding.    You do not feel your baby moving like normal. If you do not, get something to eat and drink and lay down and focus on feeling your baby move. If your baby is still not moving like normal, you should go to MAU.  Please call the clinic at [phone] if you have any concerns. It was our pleasure to serve you.  Dr. Salvadore Dom

## 2020-06-14 NOTE — Progress Notes (Signed)
  [facility] Prenatal Visit  Sarah Day is a 29 y.o. [id number] at 105w3d here for routine follow up. She is dated by LMP.  She reports no complaints. She reports fetal movement. She denies vaginal bleeding, contractions, or loss of fluid. See flow sheet for details.  Vitals:   06/14/20 0947  BP: 110/80  Pulse: 84   A/P: Pregnancy at [redacted]w[redacted]d.  Doing well.   1. Routine prenatal care:  Marland Kitchen Dating reviewed, dating tab is correct . Fetal heart tones Appropriate . Fundal height within expected range.  . Infant feeding choice: Both  . Contraception choice: Undecided , maybe POPs . Infant circumcision desired not applicable, girl  . The patient does not have a history of Cesarean delivery and no referral to [facility] for [facility] is indicated . Influenza vaccine previously administered.  05/30/20  . Tdap was not given today. 05/30/20 . 1 hour glucola, CBC, RPR, and HIV were not obtained today.    . Rh status was reviewed and patient does not need Rhogam.  Rhogam was not given today.  . Pregnancy medical home and PHQ-9 forms were not done today and reviewed.   . Childbirth and education classes were offered. . Pregnancy education regarding benefits of breastfeeding, contraception, fetal growth were discussed.  . Preterm labor and fetal movement precautions reviewed.  2. Pregnancy issues include the following and were addressed as appropriate today:  . Supervision of low-risk pregnancy  -Continue PNV  -Covid vaccine x1 administered today      Anemia Hgb 8.7  -Obtain Anemia profile  -Start Iron supplement  . Problem list and pregnancy box updated: Yes.   Patient scheduled in [facility] during third trimester on 06/29/20.  Follow up 2 weeks.

## 2020-06-15 LAB — ANEMIA PROFILE B
Basophils Absolute: 0 10*3/uL (ref 0.0–0.2)
Basos: 0 %
EOS (ABSOLUTE): 0.1 10*3/uL (ref 0.0–0.4)
Eos: 1 %
Ferritin: 37 ng/mL (ref 15–150)
Folate: >20 ng/mL (ref >3.0)
Hematocrit: 31 % — ABNORMAL LOW (ref 34.0–46.6)
Hemoglobin: 9.5 g/dL — ABNORMAL LOW (ref 11.1–15.9)
Immature Grans (Abs): 0.1 10*3/uL (ref 0.0–0.1)
Immature Granulocytes: 1 %
Iron Saturation: 20 % (ref 15–55)
Iron: 78 ug/dL (ref 27–159)
Lymphocytes Absolute: 1.4 10*3/uL (ref 0.7–3.1)
Lymphs: 15 %
MCH: 23.7 pg — ABNORMAL LOW (ref 26.6–33.0)
MCHC: 30.6 g/dL — ABNORMAL LOW (ref 31.5–35.7)
MCV: 77 fL — ABNORMAL LOW (ref 79–97)
Monocytes Absolute: 0.6 10*3/uL (ref 0.1–0.9)
Monocytes: 6 %
Neutrophils Absolute: 7.1 10*3/uL — ABNORMAL HIGH (ref 1.4–7.0)
Neutrophils: 77 %
Platelets: 274 10*3/uL (ref 150–450)
RBC: 4.01 x10E6/uL (ref 3.77–5.28)
RDW: 14.3 % (ref 11.7–15.4)
Retic Ct Pct: 3.4 % — ABNORMAL HIGH (ref 0.6–2.6)
Total Iron Binding Capacity: 392 ug/dL (ref 250–450)
UIBC: 314 ug/dL (ref 131–425)
Vitamin B-12: 278 pg/mL (ref 232–1245)
WBC: 9.2 10*3/uL (ref 3.4–10.8)

## 2020-06-20 ENCOUNTER — Encounter: Payer: Self-pay | Admitting: Family Medicine

## 2020-06-29 ENCOUNTER — Other Ambulatory Visit: Payer: Self-pay

## 2020-06-29 ENCOUNTER — Encounter: Payer: Self-pay | Admitting: Family Medicine

## 2020-06-29 ENCOUNTER — Ambulatory Visit (INDEPENDENT_AMBULATORY_CARE_PROVIDER_SITE_OTHER): Payer: 59 | Admitting: Family Medicine

## 2020-06-29 VITALS — BP 98/62 | HR 91 | Wt 155.6 lb

## 2020-06-29 DIAGNOSIS — Z3480 Encounter for supervision of other normal pregnancy, unspecified trimester: Secondary | ICD-10-CM

## 2020-06-29 DIAGNOSIS — D563 Thalassemia minor: Secondary | ICD-10-CM | POA: Insufficient documentation

## 2020-06-29 DIAGNOSIS — Z3493 Encounter for supervision of normal pregnancy, unspecified, third trimester: Secondary | ICD-10-CM

## 2020-06-29 NOTE — Patient Instructions (Signed)
**IF translating, I speak [place] Spire**  It was great to see you!  Our plans for today:  - Come back in 2 weeks for follow up on July 11, 2020 at 8:50am. - Stop taking your iron pills. Continue to take your prenatal vitamins.  Take care and seek immediate care sooner if you develop any concerns.   Dr. [facility]   Ba thng th? ba c?a [place] k? Third Trimester of Pregnancy Ba thng th? ba l t? tu?n 28 ??n tu?n 40 (thng th? 7 ??n thng th? 9). Ba thng th? ba c?ng l th?i gian khi em b trong b?ng m? (bo [place]) ?ang pht tri?n nhanh chng. Vo cu?i thng th? chn, bo thai di kho?ng 20 in s? v n?ng kho?ng 6-10 pao. Cc thay ??i c? th? trong ba thng th? ba c?a [place] k? C? th? c?a qu v? s? ti?p t?c tr?i qua nhi?u thay ??i trong th?i gian mang [place]. Nh?ng thay ??i khc nhau ? cc ph? n? s? khc nhau. Trong ba thng th? ba:  Cn n?ng c?a qu v? s? ti?p t?c t?ng. Qu v? c th? t?ng ???c 25-35 pao (11-16 kg) vo cu?i [place] k?Ladell Heads v? c th? b?t ??u c cc v?t r?n trn hng, b?ng v v.  Qu v? c th? ?i ti?u th??ng xuyn h?n v bo thai di chuy?n xu?ng th?p h?n vo khung ch?u c?a qu v? v ? vo bng quang c?a qu v?.  Qu v? c th? b? ho?c ti?p t?c b? ? nng. Nguyn nhn c?a tnh tr?ng ny l do cc hc-mn t?ng lm cc c? ? ???ng tiu ha ho?t ??ng ch?m l?i.  Qu v? c th? pht tri?n ho?c ti?p t?c b? to bn do t?ng cc hc-mn lm ch?m qu trnh tiu ha v lm cc c? c ch?c n?ng ??y ch?t th?i qua ???ng ru?t y?u ?i.  Qu v? c th? b? tr?. Nh?ng t?nh m?ch b? s?ng (gin t?nh m?ch) trong tr?c trng c th? gy ng?a ho?c gy ?au ??n.  Qu v? c th? b? s?ng, phnh t?nh m?ch (gin t?nh m?ch) ? chn.  Qu v? c th? b? ?au nh?c c? th? nhi?u h?n ? khung ch?u, l?ng ho?c ?i. V?n ?? ny l do t?ng cn v t?ng cc hc-mn lm th? gic cc kh?p.  Qu v? c nh?ng thay ??i v? tc. Nh?ng thay ??i ny c th? bao g?m tc dy ln, tc m?c nhanh h?n v thay ??i v? k?t c?u tc. M?t s?  ph? n? c?ng b? r?ng tc trong khi ho?c sau khi mang thai, ho?c c?m th?y tc kh ho?c m?ng. Tc c?a qu v? s? c nhi?u kh? n?ng s? tr? l?i bnh th??ng sau khi em b ???c sinh ra.  V c?a qu v? s? ti?p t?c pht tri?n v ti?p t?c tr? nn nh?y c?m ?au. Ch?t d?ch mu vng (s?a non) c th? r? ra ? v c?a qu v?. ?y l lo?i s?a ??u tin m c? th? qu v? s?n sinh ra cho em b.  R?n c?a qu v? c th? l?i ra.  Quy? vi? c th? th?y s?ng t?ng ln ? bn tay, m?t ho?c m?t c chn.  Qu v? c th? t?ng c?m gic ?au bu?t ho?c t ? bn tay, cnh tay v chn. Da ? b?ng qu v? c?ng c th? c?m th?y t b.  Qu v? c th? c?m th?y kh th? v t? cung to ra.  Qu v?  c th? b? kh ng? nhi?u h?n. Nguyn nhn l do kch th??c b?ng c?a qu v?, t?ng nhu c?u ti?u ti?n v t?ng qu trnh chuy?n ha c?a c? th?.  Qu v? c th? nh?n th?y [place] nhi "r?i xu?ng", ho?c di chuy?n xu?ng th?p h?n trong b?ng c?a qu v? (sa b?ng).  Qu v? c th? ra kh h? ? m ??o nhi?u h?n.  Qu v? c th? th?y cc kh?p c?a qu v? c v? l?ng l?o v c th? b? ?au xung quanh x??ng ch?u. Nh?ng g d? ki?n s? x?y ra ? cc l?n khm tr??c khi sinh Qu v? s? ???c khm tr??c khi sinh 2 tu?n m?t l?n cho ??n tu?n 36. Sau ?, qu v? s? ???c khm hng tu?n tr??c khi sinh. Trong m?t l?n khm tr??c khi sinh th??ng quy:  Qu v? s? ???c cn ?? ??m b?o qu v? v [place] nhi ?ang pht tri?n bnh th??ng.  Qu v? s? ???c ?o huy?t p.  Qu v? s? ???c ?o vng b?ng ?? theo di s? pht tri?n c?a b.  Nh?p tim thai s? ???c nghe.  B?t k? k?t qu? ki?m tra no t? l?n khm tr??c ? c?ng s? ???c th?o lu?n.  Qu v? c th? ???c ki?m tra c? t? cung g?n ngy d? sinh ?? xem c? t? cung ? m?m ho?c m?ng ch?a (m? ra).  Qu v? s? ???c xt nghi?m tm lin c?u khu?n [place]. Vi?c ny x?y ra trong kho?ng t? tu?n 35 ??n 37. Chuyn gia ch?m [place] s?c kh?e c th? h?i qu v?:  K? ho?ch sinh c?a qu v? l g.  Qu v? c?m th?y th? no.  Qu v? c c?m th?y em b c? ??ng hay khng.  Qu  v? c b?t k? tri?u ch?ng b?t th??ng no, ch?ng h?n nh? ch?y d?ch, ch?y mu, ?au ??u r?t nhi?u ho?c co th?t ? b?ng hay khng.  Qu v? c ?ang s? d?ng b?t k? s?n ph?m thu?c l no, bao g?m thu?c l d?ng ht, thu?c l d?ng nhai v thu?c l ?i?n t? hay khng.  Qu v? c b?t k? cu h?i no hay khng. Nh?ng ki?m tra ho?c sng l?c khc c th? ???c th?c hi?n trong ba thng th? hai c?a qu v? bao g?m:  Cc xt nghi?m mu ki?m tra n?ng ?? s?t th?p (thi?u mu).  Ki?m tra [place] ?? ki?m tra s?c kh?e, m?c ?? ho?t ??ng v s? pht tri?n c?a [place] nhi. Vi?c ki?m tra ???c th?c hi?n n?u qu v? c m?t s? tnh tr?ng b?nh l nh?t ??nh, ho?c n?u c cc v?n ?? trong qu trnh mang thai.  Th? nghi?m khng g?ng s?c (NST). Th? nghi?m ny ki?m tra s?c kh?e c?a em b ?? ??m b?o khng c d?u hi?u c?a cc v?n ??, ch?ng h?n nh? em b khng nh?n ??  xi. Trong th? nghi?m ny, m?t d?i b?ng ???c qu?n xung quanh b?ng qu v?. Em b s? ???c lm cho di chuy?n v nh?p tim s? ???c theo di trong [employer]. Chuy?n d? gi? l g? Chuy?n d? gi? l tnh tr?ng trong ? qu v? c?m th?y cc c?n co c? nh?, khng ??u ? t? cung (co bp) th??ng h?t khi ngh? ng?i, thay ??i t? th? ho?c u?ng n??c. Cc c?n co ny ???c g?i l c?n co [employer]. Cc c?n co c th? ko di trong nhi?u gi?, nhi?u ngy, ho?c th?m ch nhi?u tu?n tr??c khi chuy?n  d? th?t. N?u cc c?n co xu?t hi?n theo cc kho?ng ??nh k?, tr? nn th??ng xuyn h?n, t?ng c??ng ??, ho?c gy ?au, qu v? c?n ?i g?p chuyn gia ch?m [place] s?c kh?e. Cc d?u hi?u chuy?n d? l g?  Co th?t ? b?ng.  Co th?t ??nh k? b?t ??u cch nhau 10 pht v tr? nn m?nh h?n v th??ng xuyn h?n theo th?i gian.  Cc c?n co b?t ??u t? ??nh t? cung v lan xu?ng b?ng d??i v l?ng.  T?ng p l?c ln khung ch?u v ?au l?ng m ?.  Ti?t d?ch nh?y ton n??c ho?c l?n mu t? m ??o.  R r? n??c ?i. Hi?n t??ng ny c?ng ???c g?i l "v? ?i." N??c ?i c th? ra thnh dng ch?y ch?m ho?c phun ? ?t. Hy cho chuyn gia ch?m [place]  s?c kh?e bi?t n?u n??c ?i c mu ho?c mi l?. N?u qu v? c b?t k? d?u hi?u no trong s? cc d?u hi?u ny, hy g?i ngay cho chuyn gia ch?m [place] s?c kh?e, ngay c? khi ch?a ??n ngy d? sinh. Tun th? nh?ng h??ng d?n ny ? nh: Thu?c  Tun th? cc ch? d?n c?a chuyn gia ch?m [place] s?c kh?e v? vi?c s? d?ng thu?c. M?t s? lo?i thu?c c? th? co? th? an ton ho?c khng an ton [place] dng trong qu trnh [place].  U?ng vitamin tr??c khi sinh c t nh?t 600 microgram (mcg) axit folic.  N?u quy? vi? bi? ta?o bo?n, hy th? dng thu?c lm m?m phn n?u chuyn gia ch?m [place] s?c kh?e c?a qu v? ch?p thu?n. ?n v u?ng   ?n ch? ?? ?n cn b?ng bao g?m tri cy t??i v rau c?, ng? c?c nguyn cm, cc ngu?n protein t?t nh? th?t, tr?ng, ??u h? v s?a t bo. Chuyn gia ch?m [place] s?c kh?e c?a quy? vi? s? gip quy? vi? xc ??nh m??c t?ng cn phu? h??p cho quy? vi?.  [place] th?t s?ng v pho mt ch?a n?u chn. Nh?ng th? ny mang m?m b?nh c th? gy d? t?t b?m sinh cho em b.  N?u qu v? s? d?ng l??ng canxi th?p t? th?c ph?m, hy trao ??i v?i chuyn gia ch?m [place] s?c kh?e v? vi?c qu v? c c?n dng th?c ph?m b? sung canxi hng ngy hay khng.  ?n b?n ho?c n?m b?a ?n nh? thay v ba b?a l?n m?i ngy.  H?n ch? cc lo?i th?c ?n giu ch?t bo v ???ng tinh luy?n, ch?ng h?n nh? ?? ?n chin rn v ?? ng?t.  ?? ng?n ng?a to bn: ? U?ng ?? n??c ?? gi? cho n??c ti?u trong ho?c c mu vng nh?t. ? ?n th?c ?n giu ch?t x? nh? tri cy t??i v rau, ng? c?c nguyn h?t v cc lo?i ??u. Ho?t ??ng  Ch? t?p th? d?c theo ch? d?n c?a chuyn gia ch?m [place] s?c kh?e. H?u h?t ph? n? c th? ti?p t?c ch? ?? t?p luy?n bnh th??ng c?a h? trong khi mang [place]. C? g??ng t?p th? du?c 30 phu?t m?i nga?y, t nh?t 5 nga?y m?i tu?n. Ng?ng t?p th? d?c n?u qu v? c cc c?n co t? cung.  Trnh nng v?t n?ng.  Khng t?p th? d?c ? mi tr??ng qu nng ho?c ?m ho?c ? ?? cao l?n.  Mang gi?y gt th?p, tho?i mi.  Th?c hnh t? th? t?t.  Quy? vi? c  th? ti?p t?c quan h? tnh d?c tr? khi chuyn gia ch?m [place] s?c  kh?e ni khc. Gi?m ?au v gi?m c?m gic kh ch?u  Ngh? ng?i th??ng xuyn v nng cao chn trong khi ngh? n?u qu v? b? chu?t rt chn ho?c ?au th?t l?ng.  T?m b?n ng?i n??c ?m ?? lm d?u ?au ho?c d?u c?m gic kh ch?u do b?nh tr? gy ra. S? d?ng kem ?i?u tr? tr? n?u chuyn gia ch?m [place] s?c kh?e ch?p thu?n.  M?c m?t chi?c o ng?c nng ?? v hi?u qu? ?? ng?n ng?a c?m gic kh ch?u do v b? nh?y c?m ?au.  N?u qu v? b? gin t?nh m?ch: ? M?c qu?n n?t nng b?ng ho?c ?i t?t p theo ch? d?n c?a chuyn gia ch?m [place] s?c kh?e. ? Nng cao chn c?a qu v? trong 15 pht, 3-4 l?n m?t ngy. Ch?m [place] tr???c khi sinh  Vi?t ra cu h?i c?a quy? vi?. ?em ca?c cu ho?i ??n ca?c bu?i kha?m tr???c khi sinh.  Tun th? t?t c? cc cu?c h?n khm tr??c khi sinh theo ch? d?n c?a chuyn gia ch?m [place] s?c kh?e. ?i?u ny c vai tr quan tr?ng. An ton  Lun ?eo dy th?t an ton c?a qu v? khi li xe.  L?p m?t danh sch cc s? ?i?n tho?i c?p c?u, bao g?m s? ?i?n thoa?i gia ?nh, b?n b, b?nh vi?n v c?nh st v s? c?u h?a. H??ng d?n chung  Trnh h?p v? sinh c?a mo v ??t v? sinh dnh cho mo. Nh?ng th? ny mang m?m b?nh c th? gy d? t?t b?m sinh cho em b. N?u qu v? nui mo, hy nh? m?t ai ? d?n d?p h?p v? sinh thay cho qu v?.  Khng ?i du l?ch xa tr? khi vi?c ? tuy?t ??i c?n thi?t v ch? ?i khi c s? ch?p thu?n c?a chuyn gia ch?m [place] s?c kh?e.  Khng s? d?ng b?n t?m n??c nng, phng xng h?i, ho?c nh t?m h?i.  Khng u?ng r??u.  Khng s? d?ng b?t k? s?n ph?m no ch?a nicotine ho?c thu?c l, ch?ng ha?n nh? thu?c l d?ng ht v thu?c l ?i?n t?. N?u qu v? c?n gip ?? ?? cai thu?c, hy h?i chuyn gia ch?m [place] s?c kh?e.  Khng s? d?ng b?t k? th?o d??c hay thu?c khng k ??n no. Cc ha ch?t ny ?nh h??ng ??n s? hnh thnh v pht tri?n c?a em b.  Khng thu?t r??a ho?c s? d?ng nu?t b?ng v? sinh ho?c b?ng v? sinh c mi th?m.  Khng b??t  cho chn trong th?i gian di.  ?? chu?n b? cho s? ra ??i c?a em b: ? Tham gia cc l?p h?c tr??c khi sinh ?? hi?u, th?c hnh v ??t cu h?i v? tr? d? v sinh con. ? Th?c hi?n m?t l?n th? t?i b?nh vi?n. ? Gh th?m b?nh vi?n v ?i [place] khu v?c dnh cho s?n ph?. ? S?p x?p ngh? ?? ho?c ngh? lm cha thng qua s? cho php ch? lao ??ng. ? S?p x?p ?? gia ?nh v b?n b ch?m  cho th c?ng trong khi qu v? ? b?nh vi?n. ? Mua m?t chi?c gh? ng?i  t quay m?t v? sau v ??m b?o qu v? bi?t cch l?p n vo trong xe. ? Chu?n b? ti ?? ?? ?i vi?n. ? Chu?n b? phng cho em b. ??m b?o lo?i b? t?t c? g?i v th nh?i bng kh?i c?i c?a em b ?? phng ng?a ng?t th?.  ??n khm nha s? n?u qu v? ch?a ??n trong  th?i gian qu v? mang [place]. S? d?ng m?t bn ch?i m?m ?? ?nh r?ng c?a qu v? v hy nh? nhng khi dng ch? nha khoa. Hy lin l?c v?i chuyn gia ch?m [place] s?c kh?e n?u:  Qu v? khng ch?c ch?n li?u qu v? c tr? d? ho?c li?u qu v? ? v? ?i hay ch?a.  Qu v? b? chng m?t.  Qu v? b? co th?t nh? ? vng ch?u, t?c n?ng ? vng ch?u, ?au m ? ? vng b?ng c?a qu v?.  Quy? vi? bi? ?au th?t l?ng.  Qu v? b? bu?n nn, nn m?a ho?c tiu ch?y lin t?c.  Qu v? c d?ch m ??o b?t th??ng ho?c mi hi.  Qu v? b? ?au khi ?i ti?u. Yu c?u tr? gip ngay l?p t?c n?u:  Qu v? b? v? ?i tr??c 37 tu?n.  Qu v? b? cc c?n co ??nh k? cch nhau ch?a ??n 5 pht tr??c 37 tu?n.  Qu v? b? s?t.  Qu v? b? r r? d?ch ? m ??o.  Qu v? c ra ??m mu ho?c ch?y mu ? m ??o.  Qu v? b? co th?t ho?c ?au d? d?i ? b?ng.  Qu v? s?t cn ho?c t?ng cn nhanh.  Qu v? b? kh th? cng v?i ?au ng?c.  Qu v? th?y s?ng ??t ng?t ho?c r?t nhi?u ? m?t, bn tay, m?t c chn, bn chn ho?c chn.  Em b c? ??ng t h?n 10 l?n trong 2 gi?Ladell Heads v? b? ?au ??u r?t nhi?u m khng h?t sau khi dng thu?c.  Qu v? b? thay ??i th? l?c. Tm t?t  Ba thng th? ba l t? tu?n 28 ??n tu?n 40, thng th? 7 ??n thng th? 9. Ba thng th? ba  c?ng l th?i gian khi em b trong b?ng m? (bo [place]) ?ang pht tri?n nhanh chng.  Trong ba thng th? ba, c?m gic kh ch?u c?a qu v? c th? t?ng ln khi qu v? v em b ti?p t?c t?ng cn. Qu v? c th? b? ?au b?ng, ?au chn v ?au l?ng, kh ng? v t?ng nhu c?u ti?u ti?n.  Trong ba thng th? ba, ng?c qu v? s? ti?p t?c pht tri?n v ti?p t?c tr? nn nh?y c?m ?au. Ch?t d?ch mu vng (s?a non) c th? r? ra ? v c?a qu v?. ?y l lo?i s?a ??u tin m c? th? qu v? s?n sinh ra cho em b.  Chuy?n d? gi? l tnh tr?ng m trong ? qu v? c?m th?y cc c?n co c? nh?, khng ??u ? t? cung (co bp) cu?i cng t? h?t. Cc c?n co ny ???c g?i l c?n co [employer]. Cc c?n co c th? ko di trong nhi?u gi?, nhi?u ngy, ho?c th?m ch nhi?u tu?n tr??c khi chuy?n d? th?t.  Cc d?u hi?u chuy?n d? c th? bao g?m: co th?t ? b?ng; cc c?n co th?t ??nh k? b?t ??u cch nhau 10 pht v tr? nn m?nh h?n v th??ng xuyn h?n theo th?i gian; ti?t d?ch nh?y ton n??c ho?c c mu ? m ??o; t?ng p l?c ln khung ch?u v ?au l?ng m ?; v ch?y n??c ?i. Thng tin ny khng nh?m m?c ?ch thay th? cho l?i khuyn m chuyn gia ch?m [place] s?c kh?e ni v?i qu v?. Hy b?o ??m qu v? ph?i th?o lu?n b?t k? v?n ?? g m qu v? c v?i chuyn gia ch?m [place] s?c kh?e c?a qu v?.  Document Revised: 01/14/2017 Document Reviewed: 01/14/2017 Elsevier Patient Education  2020 [employer].

## 2020-06-29 NOTE — Progress Notes (Signed)
  Patient Name: Sarah Day Date of Birth: Oct 07, 1990 Date of Visit: 06/29/20 PCP: Lavonda Jumbo, DO Third Trimester Prenatal Visit   Chief Complaint: prenatal care  The patient speaks [place] as their primary language.  An interpreter was used for the entire visit. None available in patient's native language, [place] used.    Subjective: Sarah Day is a pleasant G3P2002 at [redacted]w[redacted]d dated by LMP. She has no unusual complaints today.  She denies vaginal bleeding or discharge. She reports good fetal movement. She denies contractions. She denies chest pain, shortness of breath, lightheadedness. She is taking iron supplementation, PNV.  ROS: per HPI.   I have reviewed the patient's medical, surgical, family, and social history as appropriate.   Vitals:   06/29/20 1032  BP: 98/62  Pulse: 91   Last Weight  Most recent update: 06/29/2020 10:32 AM   Weight  70.6 kg (155 lb 9.6 oz)           See flow sheet for exam.  Fetal heart tones documented in flow sheet  Fundal height documented in flow sheet   Sarah Day is a pleasant G3P2002 at [redacted]w[redacted]d presenting for routine third trimester prenatal care. Overall she is doing well.   Postpartum plans: - delivery planning: vaginal, unsure of pain control, possibly epidural. - feeding: both - pediatrician: [facility] - contraception: unsure, counseling provided  FHR: 145 Uterine size: 31cm Weight gain in pregnancy: 1lb.  COVID vaccine x1 given 06/14/20, next due 07/05/20. Tdap received 05/30/20. Flu vaccine received 05/30/20.  Anemia - most likely d/t Hb [place] based on Hb Fractionation Cascade in early pregnancy. Anemia panel 9/29 not consistent with iron deficiency at last visit. Will d/c iron supplementation to avoid iron overload. Plan to repeat CBC at 36wks.  Nausea in pregnancy - resolved.  Inadequate weight gain - 1lb weight gain in current pregnancy. Per chart review, was seen in previous pregnancy as well. Anatomy and follow  up scans this pregnancy show adequate growth. Plan to further assess diet patterns at next visit.  Anticipatory Guidance and Prenatal Education provided on the following topics: - Recommended continuing Prenatal vitamin  - Discussed options for Contraception postpartum - Discussed mode of delivery - Discussed pain control in labor - Discussed breastfeeding and benefits for infant  - Problem list and prenatal box updated   Follow up in 2 weeks. Ensure discussion of weight gain at next visit.   I discussed the plan of care with the fellow physician and agree with below documentation.  Terisa Starr, MD

## 2020-07-05 ENCOUNTER — Other Ambulatory Visit: Payer: Self-pay

## 2020-07-05 ENCOUNTER — Ambulatory Visit (INDEPENDENT_AMBULATORY_CARE_PROVIDER_SITE_OTHER): Payer: Medicaid Other

## 2020-07-05 DIAGNOSIS — Z23 Encounter for immunization: Secondary | ICD-10-CM | POA: Diagnosis not present

## 2020-07-05 NOTE — Progress Notes (Signed)
   Covid-19 Vaccination Clinic  Name:  Sarah Day    MRN: [id number] DOB: [birth year]  07/05/2020   Patient presents to nurse clinic for second COVID vaccination. Patient denies previous allergic reaction and answers no to all screening questions. Administered in RD, site unremarkable, tolerated injection well.   Ms. Behl was observed post Covid-19 immunization for 15 minutes without incident. She was provided with Vaccine Information Sheet and instruction to access the V-Safe system.   Ms. Guilbault was instructed to call 911 with any severe reactions post vaccine: Marland Kitchen Difficulty breathing  . Swelling of face and throat  . A fast heartbeat  . A bad rash all over body  . Dizziness and weakness  Provided patient with updated immunization record and card.   Veronda Prude, RN

## 2020-07-11 ENCOUNTER — Ambulatory Visit (INDEPENDENT_AMBULATORY_CARE_PROVIDER_SITE_OTHER): Payer: Medicaid Other | Admitting: Student in an Organized Health Care Education/Training Program

## 2020-07-11 ENCOUNTER — Other Ambulatory Visit: Payer: Self-pay

## 2020-07-11 VITALS — BP 98/58 | HR 85 | Wt 153.6 lb

## 2020-07-11 DIAGNOSIS — Z3493 Encounter for supervision of normal pregnancy, unspecified, third trimester: Secondary | ICD-10-CM

## 2020-07-11 DIAGNOSIS — O2613 Low weight gain in pregnancy, third trimester: Secondary | ICD-10-CM

## 2020-07-11 NOTE — Patient Instructions (Signed)
It was a pleasure to see you today!  To summarize our discussion for this visit:  Your weight has gone down since you last visit. This is concerning as your baby needs to gain extra weight to keep warm and for energy after it is born. Your measurements were normal today so we can wait one more week of monitoring.  Please eat 3 full meals per day and at least 3 snacks per day.   Come back in 1 week for a weight check. If you are still losing weight, we should consider getting a repeat ultrasound.   Your next OB visit will be in 2 weeks.   Call the clinic at [phone] if your symptoms worsen or you have any concerns.   Thank you for allowing me to take part in your care,  Dr. Jamelle Rushing

## 2020-07-11 NOTE — Progress Notes (Signed)
  [facility] Prenatal Visit  Vietnamese interpretor throughout   [facility] [place] is a 29 y.o. [id number] at [redacted]w[redacted]d here for routine follow up. She reports no bleeding, no contractions, no cramping and no leaking.  She reports fetal movement. She denies vaginal bleeding, contractions, or loss of fluid.  See flow sheet for details.  COVID injection on Oct 20th- after second shot got sick with a fever. Worried that she didn't feel a lot of fetal movement that night but has had normal movement since then. She feels well now.   Weight loss- describes a normal appetite. Eats 2-3 meals per day. Does not usually eat any snacks in between. Will have veggies and rice or soup for meals. Traditional cuisine for [place].  Some foods are not appealing right now like fruit. Denies financial issues with obtaining food. Denies N/V.  Vitals:   07/11/20 0908  BP: (!) 98/58  Pulse: 85   A/P: Pregnancy at [redacted]w[redacted]d.  Doing well.   1. Routine prenatal care:  Marland Kitchen Dating reviewed, dating tab is correct . Fetal heart tones: Appropriate . Fundal height: within expected range.  Marland Kitchen COVID vaccination was discussed and she has completed series.   2. Pregnancy issues include the following and were addressed as appropriate today: . Weight loss in third trimester. 155 at last appointment, 153lbs today. Recommended three full meals per day as well as 3 snacks per day. Fundal height and fetal heart tones were reassuring and normal. Nurse visit scheduled in 1 week to reweigh. If still losing weight, consider repeat US to assess fetal growth.  . Problem list and pregnancy box updated: Yes.   Follow up 2 weeks for routine prenatal visit if weight check is reassuring.

## 2020-07-18 ENCOUNTER — Other Ambulatory Visit: Payer: Self-pay

## 2020-07-18 ENCOUNTER — Ambulatory Visit: Payer: Medicaid Other

## 2020-07-18 VITALS — Wt 155.0 lb

## 2020-07-18 DIAGNOSIS — R634 Abnormal weight loss: Secondary | ICD-10-CM

## 2020-07-18 NOTE — Progress Notes (Signed)
Patient presents to nurse clinic for weight check.  Weight today 155lbs. Patient has gained 2lbs since 10/26 visit.  Patient reports eating 3 meals per day and 2 snacks per day. Patient reminded of 11/8 apt with PCP.

## 2020-07-24 ENCOUNTER — Other Ambulatory Visit: Payer: Self-pay

## 2020-07-24 ENCOUNTER — Ambulatory Visit (INDEPENDENT_AMBULATORY_CARE_PROVIDER_SITE_OTHER): Payer: Medicaid Other | Admitting: Family Medicine

## 2020-07-24 VITALS — BP 110/90 | HR 94 | Wt 158.0 lb

## 2020-07-24 DIAGNOSIS — Z3493 Encounter for supervision of normal pregnancy, unspecified, third trimester: Secondary | ICD-10-CM

## 2020-07-24 NOTE — Progress Notes (Addendum)
  [facility] Prenatal Visit  Sarah Day is a 29 y.o. [id number] at [redacted]w[redacted]d for routine follow up.  She reports left lower pelvic pain. She reports fetal movement. She denies vaginal bleeding, contractions, or loss of fluid.   See flow sheet for details.  Vitals:   07/24/20 1525  BP: 110/90  Pulse: 94   A/P: Pregnancy at [redacted]w[redacted]d.  Doing well.   1. Routine prenatal care:  Marland Kitchen Infant feeding choice: Both . Contraception choice: undecided . Infant circumcision desired not applicable, female  . Tdapwas not given today. Previously administered . COVID vaccination was discussed and patient is vaccinated.  . GBS/GC/CZ testing was not performed today.  . Preterm labor precautions reviewed. . Safe sleep discussed. . Kick counts reviewed. . Preterm labor precautions reviewed. . Safe sleep discussed. . Kick counts reviewed.  2. Pregnancy issues include the following and were addressed as appropriate today: . Poor weight gain in pregnancy   -gained 3 lbs since last visit 07/18/2020  -EFW 04/28/20 @[redacted]wks  gestation 20%  -Consider scheduling growth scan at follow up visit  *Attempted to scan for presentation, exam location of cranium thought to be asynclitic in left lower pelvis. Will confirm with follow up or growth ultrasound  . Problem list and pregnancy box updated: No, fully filled out and UTD.   Follow up in 1 week.   *Interpreter used during the entire encounter (in person) Ayannah Faddis (no relation to the patient)

## 2020-07-24 NOTE — Patient Instructions (Addendum)
It was wonderful to see you today.  Today we talked about:  Pelvic discomfort and letting us know if you have any numbness in your legs.   When to call and when to go to Maternity assessment unit.   Next week we will attempt to scan for presentation again. We will get your GBS swab.    Please call the clinic at [phone] if your symptoms worsen or you have any concerns. It was our pleasure to serve you.  Dr. Salvadore Dom   Fetal Movement Counts  What is a fetal movement count?  A fetal movement count is the number of times that you feel your baby move during a certain amount of time. This may also be called a fetal kick count. A fetal movement count is recommended for every pregnant woman. You may be asked to start counting fetal movements as early as week 28 of your pregnancy. Pay attention to when your baby is most active. You may notice your baby's sleep and wake cycles. You may also notice things that make your baby move more. You should do a fetal movement count:  When your baby is normally most active.  At the same time each day. A good time to count movements is while you are resting, after having something to eat and drink. How do I count fetal movements? 1. Find a quiet, comfortable area. Sit, or lie down on your side. 2. Write down the date, the start time and stop time, and the number of movements that you felt between those two times. Take this information with you to your health care visits. 3. Write down your start time when you feel the first movement. 4. Count kicks, flutters, swishes, rolls, and jabs. You should feel at least 10 movements. 5. You may stop counting after you have felt 10 movements, or if you have been counting for 2 hours. Write down the stop time. 6. If you do not feel 10 movements in 2 hours, contact your health care provider for further instructions. Your health care provider may want to do additional tests to assess your baby's  well-being. Contact a health care provider if:  You feel fewer than 10 movements in 2 hours.  Your baby is not moving like he or she usually does. Date: ____________ Start time: ____________ Stop time: ____________ Movements: ____________ Date: ____________ Start time: ____________ Stop time: ____________ Movements: ____________ Date: ____________ Start time: ____________ Stop time: ____________ Movements: ____________ Date: ____________ Start time: ____________ Stop time: ____________ Movements: ____________ Date: ____________ Start time: ____________ Stop time: ____________ Movements: ____________ Date: ____________ Start time: ____________ Stop time: ____________ Movements: ____________ Date: ____________ Start time: ____________ Stop time: ____________ Movements: ____________ Date: ____________ Start time: ____________ Stop time: ____________ Movements: ____________ Date: ____________ Start time: ____________ Stop time: ____________ Movements: ____________ This information is not intended to replace advice given to you by your health care provider. Make sure you discuss any questions you have with your health care provider. Document Revised: 04/22/2019 Document Reviewed: 04/22/2019 [employer] Patient Education  2020 [employer].

## 2020-08-01 ENCOUNTER — Other Ambulatory Visit: Payer: Self-pay

## 2020-08-01 ENCOUNTER — Ambulatory Visit (INDEPENDENT_AMBULATORY_CARE_PROVIDER_SITE_OTHER): Payer: Medicaid Other | Admitting: Family Medicine

## 2020-08-01 ENCOUNTER — Other Ambulatory Visit (HOSPITAL_COMMUNITY)
Admission: RE | Admit: 2020-08-01 | Discharge: 2020-08-01 | Disposition: A | Discharge status: Home / Self Care | Payer: Medicaid Other | Source: Ambulatory Visit | Attending: Family Medicine | Admitting: Family Medicine

## 2020-08-01 VITALS — BP 98/58 | HR 70 | Wt 158.0 lb

## 2020-08-01 DIAGNOSIS — O2613 Low weight gain in pregnancy, third trimester: Secondary | ICD-10-CM

## 2020-08-01 DIAGNOSIS — Z3493 Encounter for supervision of normal pregnancy, unspecified, third trimester: Secondary | ICD-10-CM | POA: Diagnosis present

## 2020-08-01 NOTE — Patient Instructions (Signed)
Fetal Movement Counts Patient Name: ________________________________________________ Patient Due Date: ____________________ What is a fetal movement count?  A fetal movement count is the number of times that you feel your baby move during a certain amount of time. This may also be called a fetal kick count. A fetal movement count is recommended for every pregnant woman. You may be asked to start counting fetal movements as early as week 28 of your pregnancy. Pay attention to when your baby is most active. You may notice your baby's sleep and wake cycles. You may also notice things that make your baby move more. You should do a fetal movement count:  When your baby is normally most active.  At the same time each day. A good time to count movements is while you are resting, after having something to eat and drink. How do I count fetal movements? 1. Find a quiet, comfortable area. Sit, or lie down on your side. 2. Write down the date, the start time and stop time, and the number of movements that you felt between those two times. Take this information with you to your health care visits. 3. Write down your start time when you feel the first movement. 4. Count kicks, flutters, swishes, rolls, and jabs. You should feel at least 10 movements. 5. You may stop counting after you have felt 10 movements, or if you have been counting for 2 hours. Write down the stop time. 6. If you do not feel 10 movements in 2 hours, contact your health care provider for further instructions. Your health care provider may want to do additional tests to assess your baby's well-being. Contact a health care provider if:  You feel fewer than 10 movements in 2 hours.  Your baby is not moving like he or she usually does. Date: ____________ Start time: ____________ Stop time: ____________ Movements: ____________ Date: ____________ Start time: ____________ Stop time: ____________ Movements: ____________ Date: ____________  Start time: ____________ Stop time: ____________ Movements: ____________ Date: ____________ Start time: ____________ Stop time: ____________ Movements: ____________ Date: ____________ Start time: ____________ Stop time: ____________ Movements: ____________ Date: ____________ Start time: ____________ Stop time: ____________ Movements: ____________ Date: ____________ Start time: ____________ Stop time: ____________ Movements: ____________ Date: ____________ Start time: ____________ Stop time: ____________ Movements: ____________ Date: ____________ Start time: ____________ Stop time: ____________ Movements: ____________ This information is not intended to replace advice given to you by your health care provider. Make sure you discuss any questions you have with your health care provider. Document Revised: 04/22/2019 Document Reviewed: 04/22/2019 Elsevier Patient Education  2020 [employer] Braxton Hicks Contractions Contractions of the uterus can occur throughout pregnancy, but they are not always a sign that you are in labor. You may have practice contractions called Braxton Hicks contractions. These false labor contractions are sometimes confused with true labor. What are Braxton Hicks contractions? Braxton Hicks contractions are tightening movements that occur in the muscles of the uterus before labor. Unlike true labor contractions, these contractions do not result in opening (dilation) and thinning of the cervix. Toward the end of pregnancy (32-34 weeks), Braxton Hicks contractions can happen more often and may become stronger. These contractions are sometimes difficult to tell apart from true labor because they can be very uncomfortable. You should not feel embarrassed if you go to the hospital with false labor. Sometimes, the only way to tell if you are in true labor is for your health care provider to look for changes in the cervix. The health care provider   will do a physical exam and may  monitor your contractions. If you are not in true labor, the exam should show that your cervix is not dilating and your water has not broken. If there are no other health problems associated with your pregnancy, it is completely safe for you to be sent home with false labor. You may continue to have Braxton Hicks contractions until you go into true labor. How to tell the difference between true labor and false labor True labor  Contractions last 30-70 seconds.  Contractions become very regular.  Discomfort is usually felt in the top of the uterus, and it spreads to the lower abdomen and low back.  Contractions do not go away with walking.  Contractions usually become more intense and increase in frequency.  The cervix dilates and gets thinner. False labor  Contractions are usually shorter and not as strong as true labor contractions.  Contractions are usually irregular.  Contractions are often felt in the front of the lower abdomen and in the groin.  Contractions may go away when you walk around or change positions while lying down.  Contractions get weaker and are shorter-lasting as time goes on.  The cervix usually does not dilate or become thin. Follow these instructions at home:   Take over-the-counter and prescription medicines only as told by your health care provider.  Keep up with your usual exercises and follow other instructions from your health care provider.  Eat and drink lightly if you think you are going into labor.  If Braxton Hicks contractions are making you uncomfortable: ? Change your position from lying down or resting to walking, or change from walking to resting. ? Sit and rest in a tub of warm water. ? Drink enough fluid to keep your urine pale yellow. Dehydration may cause these contractions. ? Do slow and deep breathing several times an hour.  Keep all follow-up prenatal visits as told by your health care provider. This is important. Contact a  health care provider if:  You have a fever.  You have continuous pain in your abdomen. Get help right away if:  Your contractions become stronger, more regular, and closer together.  You have fluid leaking or gushing from your vagina.  You pass blood-tinged mucus (bloody show).  You have bleeding from your vagina.  You have low back pain that you never had before.  You feel your baby's head pushing down and causing pelvic pressure.  Your baby is not moving inside you as much as it used to. Summary  Contractions that occur before labor are called Braxton Hicks contractions, false labor, or practice contractions.  Braxton Hicks contractions are usually shorter, weaker, farther apart, and less regular than true labor contractions. True labor contractions usually become progressively stronger and regular, and they become more frequent.  Manage discomfort from Braxton Hicks contractions by changing position, resting in a warm bath, drinking plenty of water, or practicing deep breathing. This information is not intended to replace advice given to you by your health care provider. Make sure you discuss any questions you have with your health care provider. Document Revised: 08/15/2017 Document Reviewed: 01/16/2017 Elsevier Patient Education  2020 Elsevier Inc.  

## 2020-08-01 NOTE — Progress Notes (Addendum)
  [facility] Prenatal Visit  Sarah Day is a 29 y.o. [id number] at [redacted]w[redacted]d here for routine follow up. She is dated by LMP.  She reports no complaints. She reports fetal movement. She denies vaginal bleeding, contractions, or loss of fluid. See flow sheet for details.  Vitals:   08/01/20 0843  BP: (!) 98/58  Pulse: 70    A/P: Pregnancy at [redacted]w[redacted]d.  Doing well.   1. Routine prenatal care  . Dating reviewed, dating tab is correct . Fetal heart tones Appropriate . Fundal height within expected range.  . Fetal position confirmed Vertex using Leopold's .  Marland Kitchen GBS collected today. .  . Repeat GC/CT collected today.  . The patient does not have a history of HSV and valacyclovir is not indicated at this time.  . Infant feeding choice: Both  . Contraception choice: Pills  . Infant circumcision desired not applicable . Influenza vaccine previously administered.   . Tdap previously administered between 27-36 weeks  . COVID vaccination was previously administered, patient is fully vaccinated.  . Pregnancy education regarding preterm labor, fetal movement, and contraception were discussed.    2. Pregnancy issues include the following and were addressed as appropriate today:  . Poor weight gain   Weight stable from last visit. Will continue to monitor with subsequent visits. Fundal height approopriate  . Problem list and pregnancy box updated: No.  Follow up 1 week.   *In person [place] interpreter used during encounter

## 2020-08-02 LAB — CERVICOVAGINAL ANCILLARY ONLY
Chlamydia: NEGATIVE
Comment: NEGATIVE
Comment: NORMAL
Neisseria Gonorrhea: NEGATIVE

## 2020-08-03 ENCOUNTER — Other Ambulatory Visit: Payer: Self-pay | Admitting: Family Medicine

## 2020-08-03 NOTE — Addendum Note (Signed)
Addended by: Burley Saver E on: 08/03/2020 08:50 AM   Modules accepted: Orders

## 2020-08-05 LAB — CULTURE, BETA STREP (GROUP B ONLY): Strep Gp B Culture: NEGATIVE

## 2020-08-07 ENCOUNTER — Ambulatory Visit (INDEPENDENT_AMBULATORY_CARE_PROVIDER_SITE_OTHER): Payer: Medicaid Other | Admitting: Student in an Organized Health Care Education/Training Program

## 2020-08-07 ENCOUNTER — Other Ambulatory Visit: Payer: Self-pay

## 2020-08-07 VITALS — BP 95/75 | HR 74 | Wt 159.2 lb

## 2020-08-07 DIAGNOSIS — Z3493 Encounter for supervision of normal pregnancy, unspecified, third trimester: Secondary | ICD-10-CM

## 2020-08-07 DIAGNOSIS — D563 Thalassemia minor: Secondary | ICD-10-CM

## 2020-08-07 NOTE — Patient Instructions (Signed)
It was a pleasure to see you today!  To summarize our discussion for this visit:  Your pregnancy growth is going well at todays visit.   One thing we should check today is your blood cells for anemia  We have also scheduled you for an ultrasound to make sure your babys growth is adequate since you have not gained much weight throughout the pregnancy. The information for that appointment is here: [facility] for [facility] [address], [address] 08/18/2020  1515 with showtime of 1500  At your next appointment here, we will schedule you for induction in case your pregnancy goes longer than your due date   Please return to our clinic to see [place] in 1 week.  Call the clinic at [phone] if your symptoms worsen or you have any concerns.   Thank you for allowing me to take part in your care,  Dr. Jamelle Rushing

## 2020-08-07 NOTE — Progress Notes (Signed)
  [facility] Prenatal Visit  Interpretor utilized for the entirety of this visit.   Sarah Day is a 29 y.o. G3P2002 at [redacted]w[redacted]d here for routine follow up. She is dated by LMP.  She reports 1-2 minute contractions about twice per day. . She reports fetal movement. She denies vaginal bleeding, contractions, or loss of fluid. See flow sheet for details.  Weight- gained >1lb since last visit.  GBS, GC screening at last visit were normal- patient informed us- patient informed of this upcoming information.   Vitals:   08/07/20 0951  BP: 95/75  Pulse: 74   A/P: Pregnancy at [redacted]w[redacted]d.  Doing well.   1. Routine prenatal care:  Marland Kitchen Dating reviewed, dating tab is correct . Fetal heart tones Appropriate . Fundal height within expected range.  . Fetal position confirmed Vertex using Leopold's .  Marland Kitchen GBS and gc/chlamydia testing results were reviewed today. Negative . Pregnancy education regarding labor, fetal movement,  benefits of breastfeeding, contraception, and safe infant sleep were discussed.  . Labor and fetal movement precautions reviewed. . Induction of labor discussed. And to be scheduled at next appointment in 1 week.   2. Pregnancy issues include the following and were addressed as appropriate today: . 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 Weight gain this visit as well as appropriate fundal height are reassuring.  . Problem list and pregnancy box updated: Yes.  Schedule induction and BPP at next visit  Follow up 1 week.

## 2020-08-08 LAB — CBC
Hematocrit: 31.1 % — ABNORMAL LOW (ref 34.0–46.6)
Hemoglobin: 9.8 g/dL — ABNORMAL LOW (ref 11.1–15.9)
MCH: 23.6 pg — ABNORMAL LOW (ref 26.6–33.0)
MCHC: 31.5 g/dL (ref 31.5–35.7)
MCV: 75 fL — ABNORMAL LOW (ref 79–97)
Platelets: 247 10*3/uL (ref 150–450)
RBC: 4.16 x10E6/uL (ref 3.77–5.28)
RDW: 13.6 % (ref 11.7–15.4)
WBC: 7.8 10*3/uL (ref 3.4–10.8)

## 2020-08-14 ENCOUNTER — Ambulatory Visit (INDEPENDENT_AMBULATORY_CARE_PROVIDER_SITE_OTHER): Payer: Medicaid Other | Admitting: Student in an Organized Health Care Education/Training Program

## 2020-08-14 ENCOUNTER — Other Ambulatory Visit: Payer: Self-pay

## 2020-08-14 VITALS — BP 100/75 | HR 77 | Wt 161.8 lb

## 2020-08-14 DIAGNOSIS — Z3493 Encounter for supervision of normal pregnancy, unspecified, third trimester: Secondary | ICD-10-CM | POA: Diagnosis not present

## 2020-08-14 DIAGNOSIS — O480 Post-term pregnancy: Secondary | ICD-10-CM | POA: Diagnosis not present

## 2020-08-14 DIAGNOSIS — Z3483 Encounter for supervision of other normal pregnancy, third trimester: Secondary | ICD-10-CM | POA: Diagnosis not present

## 2020-08-14 DIAGNOSIS — O2613 Low weight gain in pregnancy, third trimester: Secondary | ICD-10-CM | POA: Diagnosis not present

## 2020-08-14 DIAGNOSIS — Z3A39 39 weeks gestation of pregnancy: Secondary | ICD-10-CM

## 2020-08-14 NOTE — Patient Instructions (Signed)
It was a pleasure to see you today!  To summarize our discussion for this visit:  Today, we saw that baby is head down and your cervix is still mostly closed.   I have scheduled you to have induction of labor arriving at 11:45pm on December 5th and will start induction at midnight.  If you go into labor before this, that's ok. You can go to the delivery center and the other appointment will be cancelled.   You have an ultrasound and stress test scheduled on December 3rd. If there are abnormalities at this appointment, they will sometimes decide to induce labor earlier but everything has looked good so far.   Please return to our clinic to see me after your delivery.  Call the clinic at [phone] if your symptoms worsen or you have any concerns.   Thank you for allowing me to take part in your care,  Dr. Jamelle Rushing   Labor Induction  Labor induction is when steps are taken to cause a pregnant woman to begin the labor process. Most women go into labor on their own between 37 weeks and 42 weeks of pregnancy. When this does not happen or when there is a medical need for labor to begin, steps may be taken to induce labor. Labor induction causes a pregnant woman's uterus to contract. It also causes the cervix to soften (ripen), open (dilate), and thin out (efface). Usually, labor is not induced before 39 weeks of pregnancy unless there is a medical reason to do so. Your health care provider will determine if labor induction is needed. Before inducing labor, your health care provider will consider a number of factors, including:  Your medical condition and your baby's.  How many weeks along you are in your pregnancy.  How mature your baby's lungs are.  The condition of your cervix.  The position of your baby.  The size of your birth canal. What are some reasons for labor induction? Labor may be induced if:  Your health or your baby's health is at risk.  Your pregnancy is  overdue by 1 week or more.  Your water breaks but labor does not start on its own.  There is a low amount of amniotic fluid around your baby. You may also choose (elect) to have labor induced at a certain time. Generally, elective labor induction is done no earlier than 39 weeks of pregnancy. What methods are used for labor induction? Methods used for labor induction include:  Prostaglandin medicine. This medicine starts contractions and causes the cervix to dilate and ripen. It can be taken by mouth (orally) or by being inserted into the vagina (suppository).  Inserting a small, thin tube (catheter) with a balloon into the vagina and then expanding the balloon with water to dilate the cervix.  Stripping the membranes. In this method, your health care provider gently separates amniotic sac tissue from the cervix. This causes the cervix to stretch, which in turn causes the release of a hormone called progesterone. The hormone causes the uterus to contract. This procedure is often done during an office visit, after which you will be sent home to wait for contractions to begin.  Breaking the water. In this method, your health care provider uses a small instrument to make a small hole in the amniotic sac. This eventually causes the amniotic sac to break. Contractions should begin after a few hours.  Medicine to trigger or strengthen contractions. This medicine is given through an IV that is  inserted into a vein in your arm. Except for membrane stripping, which can be done in a clinic, labor induction is done in the hospital so that you and your baby can be carefully monitored. How long does it take for labor to be induced? The length of time it takes to induce labor depends on how ready your body is for labor. Some inductions can take up to 2-3 days, while others may take less than a day. Induction may take longer if:  You are induced early in your pregnancy.  It is your first pregnancy.  Your  cervix is not ready. What are some risks associated with labor induction? Some risks associated with labor induction include:  Changes in fetal heart rate, such as being too high, too low, or irregular (erratic).  Failed induction.  Infection in the mother or the baby.  Increased risk of having a cesarean delivery.  Fetal death.  Breaking off (abruption) of the placenta from the uterus (rare).  Rupture of the uterus (very rare). When induction is needed for medical reasons, the benefits of induction generally outweigh the risks. What are some reasons for not inducing labor? Labor induction should not be done if:  Your baby does not tolerate contractions.  You have had previous surgeries on your uterus, such as a myomectomy, removal of fibroids, or a vertical scar from a previous cesarean delivery.  Your placenta lies very low in your uterus and blocks the opening of the cervix (placenta previa).  Your baby is not in a head-down position.  The umbilical cord drops down into the birth canal in front of the baby.  There are unusual circumstances, such as the baby being very early (premature).  You have had more than 2 previous cesarean deliveries. Summary  Labor induction is when steps are taken to cause a pregnant woman to begin the labor process.  Labor induction causes a pregnant woman's uterus to contract. It also causes the cervix to ripen, dilate, and efface.  Labor is not induced before 39 weeks of pregnancy unless there is a medical reason to do so.  When induction is needed for medical reasons, the benefits of induction generally outweigh the risks. This information is not intended to replace advice given to you by your health care provider. Make sure you discuss any questions you have with your health care provider. Document Revised: 09/05/2017 Document Reviewed: 10/16/2016 Elsevier Patient Education  2020 [employer].

## 2020-08-14 NOTE — Progress Notes (Signed)
  [facility] Prenatal Visit In-person interpretor utilized for the entirety of this visit.   Sarah Day is a 29 y.o. G3P2002 at [redacted]w[redacted]d here for routine follow up. She is dated by LMP.  She reports no bleeding, no leaking and occasional contractions. She reports fetal movement. She denies vaginal bleeding, contractions, or loss of fluid. See flow sheet for details.  For a month has been having 2 minutes of tightening of abdomen happening every day. Goes away when she drinks water and raises her legs.  No leakage of fluid, blood, or mucus.   O: Blood pressure 100/75, pulse 77, weight 161 lb 12.8 oz (73.4 kg), last menstrual period 11/14/2019.  A/P: Pregnancy at [redacted]w[redacted]d.  Doing well.   1. Routine prenatal care:  Marland Kitchen Dating reviewed, dating tab is correct . Fetal heart tones Appropriate . Fundal height within expected range.  . Fetal position confirmed Vertex palpated with cervical check . Infant feeding choice: Both  . Contraception choice: Undecided  . Infant circumcision desired not applicable . Pain control in labor discussed and patient desires undecided.  . Influenza vaccine previously administered.   . Tdap previously administered between 27-36 weeks  . GBS and gc/chlamydia testing results were negative . Pregnancy education regarding labor, fetal movement,  benefits of breastfeeding, contraception, and safe infant sleep were discussed.  . Labor and fetal movement precautions reviewed. . Induction of labor discussed. Scheduled for induction at approximately 41 weeks. BPP scheduled between 40-41 weeks.  2. Pregnancy issues include the following and were addressed as appropriate today:  . Cervical check revealed finger-tip dilation, Thick, -2 station, vertex. Fundal height 39cm, FHR 145-150.   Weight increased 2lb 10oz since last visit.   BPP scheduled with [place] follow up  Elective Induction of labor scheduled for post-dates . Problem list and pregnancy box updated:  Yes.   Follow up after delivery

## 2020-08-15 ENCOUNTER — Other Ambulatory Visit: Payer: Self-pay | Admitting: Advanced Practice Midwife

## 2020-08-17 ENCOUNTER — Inpatient Hospital Stay (HOSPITAL_COMMUNITY)
Admission: AD | Admit: 2020-08-17 | Discharge: 2020-08-18 | DRG: 807 | Disposition: A | Discharge status: Home / Self Care | Payer: Medicaid Other | Attending: Obstetrics & Gynecology | Admitting: Obstetrics & Gynecology

## 2020-08-17 DIAGNOSIS — Z3A39 39 weeks gestation of pregnancy: Secondary | ICD-10-CM

## 2020-08-17 DIAGNOSIS — O2610 Low weight gain in pregnancy, unspecified trimester: Secondary | ICD-10-CM | POA: Diagnosis present

## 2020-08-17 DIAGNOSIS — Z8759 Personal history of other complications of pregnancy, childbirth and the puerperium: Secondary | ICD-10-CM | POA: Diagnosis not present

## 2020-08-17 DIAGNOSIS — Z9889 Other specified postprocedural states: Secondary | ICD-10-CM

## 2020-08-17 DIAGNOSIS — Z603 Acculturation difficulty: Secondary | ICD-10-CM

## 2020-08-17 DIAGNOSIS — Z20822 Contact with and (suspected) exposure to covid-19: Secondary | ICD-10-CM | POA: Diagnosis present

## 2020-08-17 DIAGNOSIS — O26893 Other specified pregnancy related conditions, third trimester: Secondary | ICD-10-CM | POA: Diagnosis present

## 2020-08-17 DIAGNOSIS — Z789 Other specified health status: Secondary | ICD-10-CM

## 2020-08-17 DIAGNOSIS — Z3493 Encounter for supervision of normal pregnancy, unspecified, third trimester: Secondary | ICD-10-CM

## 2020-08-17 LAB — RPR: RPR Ser Ql: NONREACTIVE

## 2020-08-17 LAB — CBC
HCT: 33.4 % — ABNORMAL LOW (ref 36.0–46.0)
Hemoglobin: 10.4 g/dL — ABNORMAL LOW (ref 12.0–15.0)
MCH: 23.6 pg — ABNORMAL LOW (ref 26.0–34.0)
MCHC: 31.1 g/dL (ref 30.0–36.0)
MCV: 75.7 fL — ABNORMAL LOW (ref 80.0–100.0)
Platelets: 285 10*3/uL (ref 150–400)
RBC: 4.41 MIL/uL (ref 3.87–5.11)
RDW: 13.5 % (ref 11.5–15.5)
WBC: 8.9 10*3/uL (ref 4.0–10.5)
nRBC: 0 % (ref 0.0–0.2)

## 2020-08-17 LAB — TYPE AND SCREEN
ABO/RH(D): A POS
Antibody Screen: NEGATIVE

## 2020-08-17 LAB — RESP PANEL BY RT-PCR (FLU A&B, COVID) ARPGX2
Influenza A by PCR: NEGATIVE
Influenza B by PCR: NEGATIVE
SARS Coronavirus 2 by RT PCR: NEGATIVE

## 2020-08-17 MED ORDER — SOD CITRATE-CITRIC ACID 500-334 MG/5ML PO SOLN: 30.0000 mL | ORAL | Status: DC | PRN

## 2020-08-17 MED ORDER — PRENATAL MULTIVITAMIN CH
1.0000 | ORAL_TABLET | Freq: Every day | ORAL | Status: DC
  Administered 2020-08-17 – 2020-08-18 (×2): 1 via ORAL
  Filled 2020-08-17 (×2): qty 1

## 2020-08-17 MED ORDER — OXYTOCIN BOLUS FROM INFUSION
333.0000 mL | Freq: Once | INTRAVENOUS | Status: AC
  Administered 2020-08-17: 333 mL via INTRAVENOUS

## 2020-08-17 MED ORDER — ACETAMINOPHEN 325 MG PO TABS: 650.0000 mg | ORAL_TABLET | ORAL | Status: DC | PRN

## 2020-08-17 MED ORDER — OXYTOCIN-SODIUM CHLORIDE 30-0.9 UT/500ML-% IV SOLN
2.5000 [IU]/h | INTRAVENOUS | Status: DC
  Filled 2020-08-17: qty 500

## 2020-08-17 MED ORDER — ACETAMINOPHEN 325 MG PO TABS
650.0000 mg | ORAL_TABLET | ORAL | Status: DC | PRN
  Administered 2020-08-17 – 2020-08-18 (×3): 650 mg via ORAL
  Filled 2020-08-17 (×3): qty 2

## 2020-08-17 MED ORDER — WITCH HAZEL-GLYCERIN EX PADS: 1.0000 "application " | MEDICATED_PAD | CUTANEOUS | Status: DC | PRN

## 2020-08-17 MED ORDER — LACTATED RINGERS IV SOLN: INTRAVENOUS | Status: DC

## 2020-08-17 MED ORDER — ONDANSETRON HCL 4 MG/2ML IJ SOLN: 4.0000 mg | INTRAMUSCULAR | Status: DC | PRN

## 2020-08-17 MED ORDER — FENTANYL CITRATE (PF) 100 MCG/2ML IJ SOLN
50.0000 ug | INTRAMUSCULAR | Status: DC | PRN
  Administered 2020-08-17: 100 ug via INTRAVENOUS
  Filled 2020-08-17 (×2): qty 2

## 2020-08-17 MED ORDER — ONDANSETRON HCL 4 MG/2ML IJ SOLN: 4.0000 mg | Freq: Four times a day (QID) | INTRAMUSCULAR | Status: DC | PRN

## 2020-08-17 MED ORDER — TETANUS-DIPHTH-ACELL PERTUSSIS 5-2.5-18.5 LF-MCG/0.5 IM SUSY: 0.5000 mL | PREFILLED_SYRINGE | Freq: Once | INTRAMUSCULAR | Status: DC

## 2020-08-17 MED ORDER — LACTATED RINGERS IV SOLN: 500.0000 mL | INTRAVENOUS | Status: DC | PRN

## 2020-08-17 MED ORDER — DIPHENHYDRAMINE HCL 25 MG PO CAPS: 25.0000 mg | ORAL_CAPSULE | Freq: Four times a day (QID) | ORAL | Status: DC | PRN

## 2020-08-17 MED ORDER — OXYCODONE-ACETAMINOPHEN 5-325 MG PO TABS: 2.0000 | ORAL_TABLET | ORAL | Status: DC | PRN

## 2020-08-17 MED ORDER — IBUPROFEN 600 MG PO TABS
600.0000 mg | ORAL_TABLET | Freq: Four times a day (QID) | ORAL | Status: DC
  Administered 2020-08-17 – 2020-08-18 (×4): 600 mg via ORAL
  Filled 2020-08-17 (×5): qty 1

## 2020-08-17 MED ORDER — MEASLES, MUMPS & RUBELLA VAC IJ SOLR: 0.5000 mL | Freq: Once | INTRAMUSCULAR | Status: DC

## 2020-08-17 MED ORDER — SENNOSIDES-DOCUSATE SODIUM 8.6-50 MG PO TABS
2.0000 | ORAL_TABLET | ORAL | Status: DC
  Administered 2020-08-17: 2 via ORAL
  Filled 2020-08-17: qty 2

## 2020-08-17 MED ORDER — ONDANSETRON HCL 4 MG PO TABS: 4.0000 mg | ORAL_TABLET | ORAL | Status: DC | PRN

## 2020-08-17 MED ORDER — BENZOCAINE-MENTHOL 20-0.5 % EX AERO
1.0000 "application " | INHALATION_SPRAY | CUTANEOUS | Status: DC | PRN
  Administered 2020-08-17 – 2020-08-18 (×2): 1 via TOPICAL
  Filled 2020-08-17 (×2): qty 56

## 2020-08-17 MED ORDER — OXYCODONE-ACETAMINOPHEN 5-325 MG PO TABS: 1.0000 | ORAL_TABLET | ORAL | Status: DC | PRN

## 2020-08-17 MED ORDER — SIMETHICONE 80 MG PO CHEW: 80.0000 mg | CHEWABLE_TABLET | ORAL | Status: DC | PRN

## 2020-08-17 MED ORDER — DIBUCAINE (PERIANAL) 1 % EX OINT: 1.0000 "application " | TOPICAL_OINTMENT | CUTANEOUS | Status: DC | PRN

## 2020-08-17 MED ORDER — LIDOCAINE HCL (PF) 1 % IJ SOLN
30.0000 mL | INTRAMUSCULAR | Status: AC | PRN
  Administered 2020-08-17: 30 mL via SUBCUTANEOUS
  Filled 2020-08-17: qty 30

## 2020-08-17 MED ORDER — COCONUT OIL OIL: 1.0000 "application " | TOPICAL_OIL | Status: DC | PRN

## 2020-08-17 NOTE — MAU Note (Signed)
Ctxs since 2200. Denies VB or LOF. Pt taken straight to RM # 129 and bypassed Triage due to discomfort. Helped to change clothes and into bed for sve. Unable to get [facility] interpreter on video interpreter. Told couple we were working on interpreter and FOB started he speaks [place].

## 2020-08-17 NOTE — Discharge Summary (Signed)
Postpartum Discharge Summary    Patient Name: Sarah Day DOB: 04-22-1991 MRN: [id number]  Date of admission: 08/17/2020 Delivery date:08/17/2020  Delivering provider: Arrie Senate  Date of discharge: 08/18/2020  Admitting diagnosis: Indication for care in labor and delivery, antepartum [O75.9] Intrauterine pregnancy: [redacted]w[redacted]d    Secondary diagnosis:  Active Problems:   Poor weight gain of pregnancy   Supervision of low-risk pregnancy, third trimester   Indication for care in labor and delivery, antepartum   Vaginal delivery   First degree perineal laceration   Language barrier  Additional problems: none    Discharge diagnosis: Term Pregnancy Delivered                                              Post partum procedures:None Augmentation: AROM Complications: None  Hospital course: Onset of Labor With Vaginal Delivery      29y.o. yo [id number] admitted in Active Labor on 08/17/2020. Patient had an uncomplicated labor course as follows:  Membrane Rupture Time/Date: 3:23 AM ,08/17/2020   Delivery Method:Vaginal, Spontaneous  Episiotomy: None  Lacerations:  1st degree  Patient had an uncomplicated postpartum course.  She is ambulating, tolerating a regular diet, passing flatus, and urinating well. Patient is discharged home in stable condition on 08/18/20.  Newborn Data: Birth date:08/17/2020  Birth time:3:46 AM  Gender:Female  Living status:Living  Apgars:7 ,9  Weight:6 lb 12.1 oz (3.065 kg)   Magnesium Sulfate received: No BMZ received: No Rhophylac:N/A MMR:N/A T-DaP:Given prenatally Flu: Yes Transfusion:No  Physical exam  Vitals:   08/17/20 1140 08/17/20 1630 08/17/20 2055 08/18/20 0528  BP: 118/76 113/63 (!) 99/59 109/72  Pulse: 65 62 67 65  Resp: 17 18  18   Temp: (!) 97.4 F (36.3 C) 98.3 F (36.8 C) 97.9 F (36.6 C) 97.9 F (36.6 C)  TempSrc: Oral Oral Oral Oral  SpO2:    100%  Weight:      Height:       General: alert, cooperative and no  distress Lochia: appropriate Uterine Fundus: firm Incision: N/A DVT Evaluation: No evidence of DVT seen on physical exam. Labs: Lab Results  Component Value Date   WBC 8.9 08/17/2020   HGB 10.4 (L) 08/17/2020   HCT 33.4 (L) 08/17/2020   MCV 75.7 (L) 08/17/2020   PLT 285 08/17/2020   CMP Latest Ref Rng & Units 05/29/2016  Glucose 65 - 99 mg/dL 82  BUN 7 - 25 mg/dL 9  Creatinine 0.50 - 1.10 mg/dL 0.52  Sodium 135 - 146 mmol/L 139  Potassium 3.5 - 5.3 mmol/L 4.0  Chloride 98 - 110 mmol/L 105  CO2 20 - 31 mmol/L 27  Calcium 8.6 - 10.2 mg/dL 9.6  Total Protein 6.1 - 8.1 g/dL 7.8  Total Bilirubin 0.2 - 1.2 mg/dL 0.4  Alkaline Phos 33 - 115 U/L 70  AST 10 - 30 U/L 14  ALT 6 - 29 U/L 10   Edinburgh Score: Edinburgh Postnatal Depression Scale Screening Tool 08/18/2020  I have been able to laugh and see the funny side of things. 0  I have looked forward with enjoyment to things. 0  I have blamed myself unnecessarily when things went wrong. 1  I have been anxious or worried for no good reason. 0  I have felt scared or panicky for no good reason. 0  Things have been getting  on top of me. 0  I have been so unhappy that I have had difficulty sleeping. 1  I have felt sad or miserable. 0  I have been so unhappy that I have been crying. 0  The thought of harming myself has occurred to me. 0  Edinburgh Postnatal Depression Scale Total 2     After visit meds:  Allergies as of 08/18/2020   No Known Allergies     Medication List    STOP taking these medications   calcium carbonate 1250 (500 Ca) MG chewable tablet Commonly known as: OS-CAL   Tylenol 325 MG Caps Generic drug: Acetaminophen Replaced by: acetaminophen 325 MG tablet     TAKE these medications   acetaminophen 325 MG tablet Commonly known as: Tylenol Take 2 tablets (650 mg total) by mouth every 4 (four) hours as needed (for pain scale < 4). Replaces: Tylenol 325 MG Caps   ibuprofen 600 MG tablet Commonly known  as: ADVIL Take 1 tablet (600 mg total) by mouth every 6 (six) hours.   norethindrone 0.35 MG tablet Commonly known as: MICRONOR Take 1 tablet (0.35 mg total) by mouth daily.   Prenatal 27-1 MG Tabs Take 1 tablet by mouth daily.      Discharge home in stable condition Infant Feeding: Bottle and Breast Infant Disposition:home with mother Discharge instruction: per After Visit Summary and Postpartum booklet. Activity: Advance as tolerated. Pelvic rest for 6 weeks.  Diet: routine diet Future Appointments: Future Appointments  Date Time Provider [place]  08/18/2020  3:00 PM [facility] NURSE [facility]  08/18/2020  3:15 PM [facility] US2 [facility]  08/25/2020  9:45 AM [facility]-SCREENING [facility] None   Follow up Visit: FOB made aware to call schedule PP f/u visit  Please schedule this patient for a In person postpartum visit in 4 weeks with the following provider: Any provider. Additional Postpartum F/U:none  Low risk pregnancy complicated by: n/a Delivery mode:  Vaginal, Spontaneous  Anticipated Birth Control:  POPs  08/18/2020 Gabriel Carina, CNM

## 2020-08-17 NOTE — Discharge Instructions (Signed)

## 2020-08-17 NOTE — Lactation Note (Signed)
This note was copied from a baby's chart. Lactation Consultation Note  Patient Name: Sarah Day Today's Date: 08/17/2020 Reason for consult: Initial assessment;Term Baby 20hrs old, [place] and [place] interpreter unavailable, mom states this is ok, understands some English. Mom reports previous breastfeeding experience without challenges, breast fed for 69mo (children now 30yo and 7yo) then switched to similac d/t returning to work. States plans are to breastfeed current baby as long as at home. Mom denies having a pump at home, however reports with [facility] services and reports with an appt tomorrow.  Mom states last feeding was ~5pm, baby nursed ~26mins, attempted to feed prior to [facility] entering room but states baby would not latch. Baby with subtle movements in bassinet, per request assisted with latch. LC got baby up to mom skin to skin, mom hand expressed drops of colostrum into baby's mouth. Baby asleep, uninterested in latching at this time and without cues, encouraged mom to hold baby skin to skin and offer breast when cues are shown, hand express and offer colostrum if with difficulty latching. Discussed skin to skin, feeding cues, feed 8-12 times in 24hrs. Mom voiced understanding and with no further concerns. Left the room with mom resting in bed holding baby skin to skin. BGilliam, RN, IBCLC  Plan - feed on cue, wake if >3hrs since last feeding - hand express after feedings, offer colostrum back to baby - call for [facility] support if with difficult latch or other cocnerns  Maternal Data Formula Feeding for Exclusion: No Has patient been taught Hand Expression?: Yes Does the patient have breastfeeding experience prior to this delivery?: Yes  Feeding Feeding Type: Breast Fed  LATCH Score Latch: Too sleepy or reluctant, no latch achieved, no sucking elicited.  Audible Swallowing: None  Type of Nipple: Everted at rest and after stimulation  Comfort (Breast/Nipple): Soft / non-tender  Hold  (Positioning): No assistance needed to correctly position infant at breast.  LATCH Score: 6  Interventions Interventions: Breast feeding basics reviewed;Assisted with latch;Skin to skin;Hand express  Lactation Tools Discussed/Used WIC Program: Yes   Consult Status Consult Status: Follow-up Date: 08/18/20 Follow-up type: In-patient    Charlynn Court 08/17/2020, 8:21 PM

## 2020-08-17 NOTE — H&P (Addendum)
OBSTETRIC ADMISSION HISTORY AND PHYSICAL  Sarah Day is a 29 y.o. female G62P2002 with IUP at [redacted]w[redacted]d by LMP presenting for SOL. She reports +FMs, No LOF, no VB, no blurry vision, headaches or peripheral edema, and RUQ pain. She plans on breast/bottle feeding. She is considering POPs for birth control. She received her prenatal [facility] at [facility]   Dating: By LMP --->  Estimated Date of Delivery: 08/20/20  Sono:   04/28/20 @[redacted]w[redacted]d , CWD, normal anatomy, cephalic presentation, 572 g, EFW  Prenatal History/Complications:  Poor weight gain throughout pregnancy  Past Medical History: Past Medical History:  Diagnosis Date  . Obesity   . Tension type headache    Past Surgical History: No past surgical history on file.  Obstetrical History: OB History    Gravida  3   Para  2   Term  2   Preterm  0   AB  0   Living  2     SAB  0   TAB  0   Ectopic  0   Multiple  0   Live Births  2          Social History Social History   Socioeconomic History  . Marital status: Married    Spouse name: Not on file  . Number of children: Not on file  . Years of education: Not on file  . Highest education level: Not on file  Occupational History    Employer: OTHER    Comment: works at [employer]%  Tobacco Use  . Smoking status: Never Smoker  . Smokeless tobacco: Never Used  Substance and Sexual Activity  . Alcohol use: No  . Drug use: No  . Sexual activity: Yes    Birth control/protection: None  Other Topics Concern  . Not on file  Social History Narrative   Works for a [employer].    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 Difficulty of Paying Living Expenses: Not on file  Food Insecurity:   . Worried About Marliss Czar in the Last Year: Not on file  . Ran Out of Food in the Last Year: Not on file  Transportation Needs:   . Lack of  Transportation (Medical): Not on file  . Lack of Transportation (Non-Medical): Not on file  Physical Activity:   . Days of Exercise per Week: Not on file  . Minutes of Exercise per Session: Not on file  Stress:   . Feeling of Stress : Not on file  Social Connections:   . Frequency of Communication with Friends and Family: Not on file  . Frequency of Social Gatherings with Friends and Family: Not on file  . Attends Religious Services: Not on file  . Active Member of Clubs or Organizations: Not on file  . Attends Programme researcher, broadcasting/film/video Meetings: Not on file  . Marital Status: Not on file   Family History: No family history on file.  Allergies: No Known Allergies  Medications Prior to Admission  Medication Sig Dispense Refill Last Dose  . Acetaminophen (TYLENOL) 325 MG CAPS Take 1 tablet by mouth every 4 (four) hours as needed. 120 capsule 0   . calcium carbonate (OS-CAL) 1250 (500 Ca) MG chewable tablet Chew 1 tablet (1,250 mg total) by mouth with breakfast, with lunch, and with evening meal. As needed for abdominal pain. 90  tablet 3   . Prenatal 27-1 MG TABS Take 1 tablet by mouth daily. 90 tablet 0    Review of Systems   All systems reviewed and negative except as stated in HPI  Blood pressure (!) 135/96, pulse 69, temperature 97.6 F (36.4 C), temperature source Oral, resp. rate 18, height 5\' 3"  (1.6 m), weight 73 kg, last menstrual period 11/14/2019. General appearance: alert and no distress Lungs: no dyspnea Heart: regular rate Abdomen: soft, non-tender; gravid Pelvic: as stated below Extremities: Homans sign is negative, no sign of DVT Presentation: cephalic by MAU SVE Fetal monitoringBaseline: 130 bpm, Variability: Good {> 6 bpm), Accelerations: Reactive and Decelerations: Absent Uterine activityFrequency: Every 2-3 minutes Dilation: 8 Effacement (%): 100 Station: 0 Exam by:: [email or web address], MD Prenatal labs: ABO, Rh: --/--/PENDING (12/02 0157) Antibody: PENDING  (12/02 0157) Rubella: 1.71 (04/07 1342) RPR: Non Reactive (09/14 0951)  HBsAg: Negative (04/07 1342)  HIV: Non Reactive (09/14 0951)  GBS: Negative/-- (11/16 0945)  1 Glucola elevated, normal 3 hour Genetic screening normal Anatomy 12-21-1999 bilateral choroid plexus cysts, normal variant given quad screen  Prenatal Transfer Tool  Maternal Diabetes: No Genetic Screening: Normal Maternal Ultrasounds/Referrals: Isolated choroid plexus cyst Fetal Ultrasounds or other Referrals:  None Maternal Substance Abuse:  No Significant Maternal Medications:  None Significant Maternal Lab Results: Group B Strep negative   Patient Active Problem List   Diagnosis Date Noted  . Indication for care in labor and delivery, antepartum 08/17/2020  . 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 Acid reflux 02/23/2020  . Supervision of low-risk pregnancy, third trimester 12/25/2019  . Poor weight gain of pregnancy 11/16/2012    Assessment/Plan:  Sarah Day is a 29 y.o. G3P2002 at [redacted]w[redacted]d here for SOL.   #Labor: Progressing well. Will continue expectant labor management. Attempted to have interpretor for patient in room, but spoke a different dialect. Patient was consented and agreeable to have FOB act as interpreter.   #Pain:prn IV pain med #FWB: Cat 1 #ID: GBS neg #MOF: breast/bottle #MOC: considering POPs #Circ:  n/a  [redacted]w[redacted]d, MD  08/17/2020, 2:31 AM  GME ATTESTATION:  I saw and evaluated the patient. I agree with the findings and the plan of care as documented in the resident's note.  14/10/2019, MD OB Fellow, Faculty [facility] 08/17/2020 3:23 AM

## 2020-08-17 NOTE — Lactation Note (Signed)
This note was copied from a baby's chart. Lactation Consultation Note  Patient Name: Sarah Day [id number] Date: 08/17/2020 Reason for consult: Follow-up assessment Baby 17hrs old, mom sitting in bed nursing baby left breast cradle hold, states feeding started at 9pm. Multiple audible swallows noted, baby observed with tight angle and nursing on nipple only, mom broke latch, nipple round on release. LC demonstrated how to sandwich breast to obtain deeper latch, showed mom baby's wider angle, however baby would not suck. Mom attempted technique but baby slipped back to nipple then released breast on own ~29min mark. Mom with transitional milk, easily expresses with breast compression. Encouraged mom to use sandwich technique with next feeding and importance r/t milk intake. Mom voiced understanding and with no further concerns. Left the room with mom holding baby skin to skin. BGilliam, RN, IBCLC   Maternal Data Formula Feeding for Exclusion: No Has patient been taught Hand Expression?: Yes Does the patient have breastfeeding experience prior to this delivery?: Yes  Feeding Feeding Type: Breast Fed  LATCH Score Latch: Grasps breast easily, tongue down, lips flanged, rhythmical sucking.  Audible Swallowing: Spontaneous and intermittent  Type of Nipple: Everted at rest and after stimulation  Comfort (Breast/Nipple): Soft / non-tender  Hold (Positioning): Assistance needed to correctly position infant at breast and maintain latch.  LATCH Score: 9  Interventions Interventions: Breast feeding basics reviewed;Assisted with latch;Skin to skin;Hand express  Lactation Tools Discussed/Used WIC Program: Yes   Consult Status Consult Status: Follow-up Date: 08/18/20 Follow-up type: In-patient    Charlynn Court 08/17/2020, 9:40 PM

## 2020-08-18 ENCOUNTER — Ambulatory Visit: Payer: Medicaid Other

## 2020-08-18 ENCOUNTER — Encounter (HOSPITAL_COMMUNITY): Payer: Self-pay | Admitting: Obstetrics & Gynecology

## 2020-08-18 DIAGNOSIS — Z8759 Personal history of other complications of pregnancy, childbirth and the puerperium: Secondary | ICD-10-CM

## 2020-08-18 MED ORDER — ACETAMINOPHEN 325 MG PO TABS: 650.0000 mg | ORAL_TABLET | ORAL | 0 refills | Status: AC | PRN

## 2020-08-18 MED ORDER — IBUPROFEN 600 MG PO TABS: 600.0000 mg | ORAL_TABLET | Freq: Four times a day (QID) | ORAL | 0 refills | Status: DC

## 2020-08-18 MED ORDER — NORETHINDRONE 0.35 MG PO TABS: 1.0000 | ORAL_TABLET | Freq: Every day | ORAL | 11 refills | Status: AC

## 2020-08-18 NOTE — Social Work (Signed)
CSW received consult to address MOB not having a car seat or crib to take newborn home. CSW met with MOB using intrepreter Bon #[id number]. CSW observed newborn in bassinet and MOB speaking with the Lactation Specialist. CSW congratulated MOB and asked if she has a crib and car seat for newborn Sarah Day. MOB stated FOB left to purchase both a car seat and crib. CSW expressed understanding and informed MOB that if she does not have either before leaving to let staff know so CSW can assist. CSW informed MOB they are unable to leave without having both. MOB again reiterated and stated "there is nothing to worry about, my husband went to buy them". CSW asked MOB if there is anything else needed before discharging. MOB stated no.   CSW provided a baby box for MOB and placed it in the room and explained to MOB it can be used for safe sleeping upon discharge if needed. CSW will follow-up with MOB once visit with lactation specialist is complete, to provide education on SIDS.   11:00a  CSW followed-up with MOB using interpreter Yen #[id number]. CSW provided SIDS education to MOB. CSW assessed MOB for PMADS. MOB stated she is currently feeling very good and happy. MOB denied having any other needs or questions at this time.   CSW identifies no further need for intervention and no barriers to discharge at this time.  Sarah Day, MSW, LCSWA Clinical Social Work [facility] [phone] 

## 2020-08-18 NOTE — Lactation Note (Signed)
This note was copied from a baby's chart. Lactation Consultation Note  Patient Name: Sarah Day [id number] Date: 08/18/2020 Reason for consult: Follow-up assessment;Other (Comment);Infant weight loss (limited English - [place] unavailable, per mom in [place] to use [employer] # [phone] - Bon)  [name] is 3 hours old and per mom last feeding was at 6:30 am for 15 mins and a wet diaper at 7 am.  Per mom breast feeding is going well and baby seems satisfied, denies sore nipples , breast are fuller today.  LC checked diaper , dry, and assisted to latch in the side lying ( moms preference) . LC noted some areola edema and baby was on and off at 1st and  LC provided a hand pump to pre - pump and hand expressed and baby latched and fed for 15 mins.  Mom mentioned she breast fed 3-4 months and her milk dried up.  LC asked mom if she had engorgement issues and she said yes.  LC suspects it possibly could have contributed to decrease milk supply.  LC Plan :  Breast shells between feedings except when sleeping until areola less swollen.  8-12 feedings a day with feeding cues and stressed to mom by 3 hours of baby not showing feeding cues , check diaper and place STS.  Prior to every feeding 1st breast until baby latches with more depth  Breast massage, hand express, pre-pump to stretch the nipple / areola complex and reverse pressure ( stretch back the areola tissue ).   LC reviewed sore nipple and engorgement and tx reviewed.  LC provided and hand pump with the #24 F and #27 f .  The #24 F fits well today and explained to mom th e#27 F is for when the milk comes in and if the #24 F is to snug to switch up .  Also provided shells while awake. Mom aware she may only need then for 1st week .  Mom expressed appreciation for the [facility] assist in English.   LC provided the [facility] pamphlet with resource phone numbers and website.       Maternal Data Has patient been taught Hand Expression?:  Yes  Feeding Feeding Type: Breast Fed  LATCH Score Latch: Repeated attempts needed to sustain latch, nipple held in mouth throughout feeding, stimulation needed to elicit sucking reflex.  Audible Swallowing: Spontaneous and intermittent  Type of Nipple: Everted at rest and after stimulation  Comfort (Breast/Nipple): Soft / non-tender  Hold (Positioning): Assistance needed to correctly position infant at breast and maintain latch.  LATCH Score: 8  Interventions Interventions: Breast feeding basics reviewed;Skin to skin;Assisted with latch;Breast massage;Hand express;Pre-pump if needed;Reverse pressure;Breast compression;Adjust position;Support pillows;Position options;Shells;Hand pump  Lactation Tools Discussed/Used Tools: Shells;Pump;Flanges Flange Size: 27;24 Shell Type: Inverted Breast pump type: Manual Pump Review: Milk Storage;Setup, frequency, and cleaning Initiated by:: MAI Date initiated:: 08/18/20   Consult Status Consult Status: Complete Date: 08/18/20    Sarah Day 08/18/2020, 11:02 AM

## 2020-08-19 ENCOUNTER — Other Ambulatory Visit (HOSPITAL_COMMUNITY): Payer: Medicaid Other

## 2020-08-21 ENCOUNTER — Inpatient Hospital Stay (HOSPITAL_COMMUNITY): Payer: Medicaid Other

## 2020-08-21 ENCOUNTER — Encounter (HOSPITAL_COMMUNITY): Payer: Self-pay | Admitting: Obstetrics & Gynecology

## 2020-08-21 ENCOUNTER — Other Ambulatory Visit: Payer: Self-pay | Admitting: Family Medicine

## 2020-08-21 DIAGNOSIS — Z3493 Encounter for supervision of normal pregnancy, unspecified, third trimester: Secondary | ICD-10-CM

## 2020-08-21 MED ORDER — PRENATAL 27-1 MG PO TABS: 1.0000 | ORAL_TABLET | Freq: Every day | ORAL | 0 refills | Status: DC

## 2020-08-25 ENCOUNTER — Other Ambulatory Visit (HOSPITAL_COMMUNITY): Payer: Medicaid Other

## 2020-08-27 ENCOUNTER — Inpatient Hospital Stay (HOSPITAL_COMMUNITY): Payer: Medicaid Other

## 2020-08-27 ENCOUNTER — Inpatient Hospital Stay (HOSPITAL_COMMUNITY)
Admission: AD | Admit: 2020-08-27 | Payer: Medicaid Other | Source: Home / Self Care | Admitting: Obstetrics & Gynecology

## 2020-09-13 ENCOUNTER — Other Ambulatory Visit: Payer: Self-pay

## 2020-09-13 ENCOUNTER — Ambulatory Visit (INDEPENDENT_AMBULATORY_CARE_PROVIDER_SITE_OTHER): Payer: Medicaid Other | Admitting: Family Medicine

## 2020-09-13 ENCOUNTER — Encounter: Payer: Self-pay | Admitting: Family Medicine

## 2020-09-13 MED ORDER — PRENATAL 27-1 MG PO TABS: 1.0000 | ORAL_TABLET | Freq: Every day | ORAL | 0 refills | Status: AC

## 2020-09-13 NOTE — Patient Instructions (Addendum)
It was wonderful to see you today.  Today we talked about:  Continuing your prenatal vitamin.   Please call the clinic at [phone] if you have any concerns. It was our pleasure to serve you.  Dr. Salvadore Dom

## 2020-09-13 NOTE — Progress Notes (Signed)
  [facility] Postpartum Visit   Sarah Day is a 29 y.o. [phone] presenting for a postpartum visit.  She has the following concerns today: No concerns She delivered via vaginally at [redacted]w[redacted]d.  She reports her vaginal bleeding has stopped. She is breast/formula feeding her infant. She feels she is bonding well. She is considering condoms  for contraception. Was prescribed micronor but did not take was not aware of prescription. Does not want pill.   Edinburgh Postnatal Depression Scale: 0 Reviewed pregnancy and delivery course and was unremarkable.  -  Vitals:   09/13/20 0924  BP: 110/62  Pulse: 86  SpO2: 98%   Exam:  General: Appears well, no acute distress. Age appropriate. Respiratory: normal effort Extremities: No edema or cyanosis. Skin: Warm and dry, no rashes noted Neuro: alert and oriented Psych: normal affect Pelvic exam: VULVA: normal appearing vulva with no masses, tenderness or lesions, VAGINA: normal appearing vagina with normal color and discharge, no lesions, CERVIX: normal appearing cervix without discharge or lesions. Laceration appears to be healing well.     A/P:  Postpartum visit: patient is 4 weeks postpartum following a vaginal delivery. -Discussed patients delivery and complications -Patient had a 1st degree laceration, perineal healing reviewed. Patient expressed understanding -Patient has urinary incontinence? No -Patient is not safe to resume physical and sexual activity -Patient does not want a pregnancy in the next year.  Desired family size is unsure children.  -Reviewed forms of contraception in tiered fashion. Patient desired condoms today.   -Return to sexual activity and contraception discussed as above.  -Discussed birth spacing of 18 months -Breastfeeding: Yes, provided support of decision and resources as indicated  -Mood: Appropriate  -Discussed sleep and fatigue management and encouraged family/community support.  -Postpartum  vaccines: N/a -Need for postpartum diabetes screening: No -Reviewed prior Pap and she is next due for Pap in 3 years.  -Follow up as needed

## 2021-02-06 ENCOUNTER — Encounter: Payer: Self-pay | Admitting: Family Medicine

## 2021-02-06 NOTE — Progress Notes (Signed)
    SUBJECTIVE:   CHIEF COMPLAINT / HPI:   Elbow RHD. Was doing her hair 2 weeks ago and hit her elbow against the wall. She reports pain since then. No bruising.  Dull pain with palpation and with moving it, trying to grab things. Took aleve without relief. Reports some weakness.  No numbness, tingling in hands. Occupation: Radio broadcast assistant. Does not lean on her elbow during the day, but does repetitive movements.   Back pain Started two weeks ago. No accident or trauma. Lower back pain in the middle. Never back pain before. Pain is dull pain, that has been stable. Hurts worst with bending over, mild pain with sitting and standing for long periods of time. Improves with lying down. No radiation, no shooting pain. Denies weakness, bladder/bowel incontinence, saddle anesthesia. No improvement with aleve. Has kept her home from work when it hurts very bad, one day in the last two weeks.   PERTINENT  PMH / PSH: Hemoglobin Constant spring trait, breastfeeding   OBJECTIVE:   BP 120/80   Pulse (!) 101   Wt 156 lb (70.8 kg)   SpO2 97%   BMI 27.63 kg/m   General: well apearing, NAD. In person interpretor accompanies patient.  Cervical spine: No gross abnormalities. No TTP of bony prominences. Full active and passive ROM. 5/5 strength.  Lumbar spine: No gross abnormalities.  Tenderness to palpation of paralumbar area around L5, S1.  No midline tenderness.  Tenderness to palpation of paraspinal muscles.  Patient has normal hip range of motion.  Pain elicited with full forward flexion and w/ extension.  Negative straight leg bilaterally. Shoulder: No gross abnormalities. No TTP. FROM.  Elbow: No gross abnormalities on inspection.  TTP of lateral epicondyle.  Full range of motion.  Strength 5 out of 5.  Pain with resisted supination and pronation at lateral epicondyle. Wrists: No gross abnormalities.  No tenderness to palpation.  Full range of motion.  5 out of 5 strength. Hands: No gross abnormalities.   5 out of 5 strength.   ASSESSMENT/PLAN:   Low back pain No red flags.  Physical exam is consistent with lumbar strain.  History and physical not concerning for fracture, infection, cauda equina.  No radiculopathy.  Will treat with Voltaren twice daily x1 week and lidocaine patches.  If no improvement, recommend physical therapy.  Elbow pain Consistent with lateral epicondylitis.  Provided patient packet information.  She can try rest, counter brace and PO voltaren as above. Patient aware that she should not use other oral NSAIDs. Also provided information on home exercises. If no improvement with conservative measures, refer to physical therapy.   Melene Plan, MD [facility]

## 2021-02-07 ENCOUNTER — Other Ambulatory Visit: Payer: Self-pay

## 2021-02-07 ENCOUNTER — Ambulatory Visit (INDEPENDENT_AMBULATORY_CARE_PROVIDER_SITE_OTHER): Payer: Medicaid Other | Admitting: Family Medicine

## 2021-02-07 ENCOUNTER — Encounter: Payer: Self-pay | Admitting: Family Medicine

## 2021-02-07 VITALS — BP 120/80 | HR 101 | Wt 156.0 lb

## 2021-02-07 DIAGNOSIS — Z789 Other specified health status: Secondary | ICD-10-CM | POA: Diagnosis present

## 2021-02-07 DIAGNOSIS — M7711 Lateral epicondylitis, right elbow: Secondary | ICD-10-CM | POA: Diagnosis not present

## 2021-02-07 DIAGNOSIS — S39012A Strain of muscle, fascia and tendon of lower back, initial encounter: Secondary | ICD-10-CM

## 2021-02-07 MED ORDER — LIDOCAINE 5 % EX PTCH: 1.0000 | MEDICATED_PATCH | CUTANEOUS | 0 refills | Status: AC

## 2021-02-07 MED ORDER — DICLOFENAC SODIUM 75 MG PO TBEC: 75.0000 mg | DELAYED_RELEASE_TABLET | Freq: Two times a day (BID) | ORAL | 0 refills | Status: AC

## 2021-02-07 NOTE — Patient Instructions (Addendum)
Khu?u tay qu?n v?t [place]  Khu?u tay qu?n v?t (b?nh vim l?i c?u ngoi x??ng cnh tay) l vim gn trn c?ng tay pha ngoi, g?n khu?u tay qu v?. Gn l cc m n?i c? v?i x??ng. Khi qu v? b? khu?u tay qu?n v?t, tnh tr?ng vim ?nh h??ng ??n cc gn m qu v? s? d?ng ?? u?n cong c? tay v c? ??ng bn tay ln trn. Tnh tr?ng vim x?y ra ? ph?n d??i c?a x??ng cnh tay trn (x??ng cnh tay), n?i gn n?i v?i x??ng (l?i c?u ngoi). Khu?u tay qu?n v?t th??ng ?nh h??ng ??n nh?ng ng??i ch?i qu?n v?t, nh?ng b?t c? ai c?ng c th? b? tnh tr?ng ny khi du?i c? tay ho?c xoay c?ng tay l?p ?i l?p l?i. Nguyn nhn g gy ra? Tnh tr?ng ny th??ng do vi?c du?i c? tay, xoay c?ng tay v dng bn tay l?p ?i l?p l?i. Tnh tr?ng ny c th? l do ch?i cc mn th? thao ho?c th?c hi?n cc ??ng tc ? c?ng tay l?p ?i l?p l?i. Trong m?t s? tr??n h?p, tnh tr?ng ny c th? do ch?n th??ng ??t ng?t gy ra. ?i?u g lm t?ng nguy c?? Qu v? d? b? khu?u tay qu?n v?t h?n n?u qu v? ch?i qu?n v?t ho?c m?t mn th? thao c dng v?t khc. Qu v? c?ng c nguy c? cao h?n n?u th??ng xuyn lm vi?c b?ng hai bn tay. Bn c?nh nh?ng ng??i ch?i qu?n v?t, nh?ng ng??i khc c nguy c? cao h?n bao g?m:  Nh?ng ng??i s? d?ng my tnh.  Cng nhn xy d?ng.  Nh?ng ng??i lm vi?c trong nh my.  Nh?c s?.  Ng??i n?u ?n.  Th? qu?. C cc d?u hi?u ho?c tri?u ch?ng g? Nh?ng tri?u ch?ng c?a tnh tr?ng ny bao g?m:  ?au v nh?y c?m ?au ? c?ng tay v ph?n ngoi khu?u tay. Ch? c?m th?y ?au khi s? d?ng cnh tay, ho?c c th? lc no c?ng ?au ? ?.  C?m gic b?ng rt b?t ??u ? khu?u tay v lan xu?ng c?ng tay.  Bn tay n?m l?i y?u. Ch?n ?on tnh tr?ng ny nh? th? no? Tnh tr?ng ny c th? ???c ch?n ?on d?a vo tri?u ch??ng, khai thc b?nh s? v khm th?c th?Ladell Heads v? c?ng c th? ???c ch?p [place] ho?c ch?p MRI (ch?p c?ng h??ng t?) ??:  Xc nh?n ch?n ?on.  Tm ki?m cc v?n ?? khc.  Ki?m tra xem c rch dy ch?ng, c? ho?c gn hay  khng. Tnh tr?ng ny ???c ?i?u tr? nh? th? no? Cch ?i?u tr? ban ??u th??ng l ngh? ng?i v ch??m ? l?nh vo cnh tay. Chuyn gia ch?m [place] s?c kh?e c?a qu v? c?ng c th? khuy?n ngh?:  Thu?c ?? gi?m ?au v gi?m vim. Nh?ng thu?c ny c th? ? d?ng vin, gel bi t?i ch?, ho?c tim thu?c steroid (cortisone).  Dy ?ai khu?u tay ?? gi?m p l?c ln khu v?c ?.  V?t l tr? li?u. Li?u php ny c th? bao g?m mt-xa ho?c cc bi t?p th? d?c ho?c c? hai.  N?p khu?u tay ?? h?n ch? v?n ??ng gy ra cc tri?u ch?ng. N?u cc ph??ng php ?i?u tr? ny khng gip lm gi?m cc tri?u ch?ng, chuyn gia ch?m [place] s?c kh?e c?a qu v? c th? khuy?n ngh? ph?u thu?t ?? lo?i b? c? b? t?n th??ng v g?n l?i c? kh?e m?nh vo x??ng. Tun th? nh?ng h??ng d?n ny ? nh: N?u  qu v? s? d?ng n?p ho?c ?ai ?eo:  ?eo n?p ho?c ?ai ?eo theo ch? d?n c?a chuyn gia ch?m [place] s?c kh?e. Ch? tho ra theo ch? d?n c?a chuyn gia ch?m [place] s?c kh?e.  Ki?m tra da [place] n?p ho?c ?ai ?eo m?i ngy. Hy cho chuyn gia ch?m [place] s?c kh?e bi?t v? b?t k? lo ng?i no.  N?i l?ng n?p n?u cc ngn tay b? ?au bu?t, t ho?c tr?? nn l?nh v xanh ti.  Gi? cho n?p s?ch s?.  N?u n?p ho?c ?ai ?eo khng ph?i lo?i ch?ng th?m n??c: ? Khng ?? n b? ??t. ? B?c n b?ng l?p ph? ch?ng th?m n??c khi qu v? t?m b?n ho?c t?m vi sen. X? tr ?au, c?ng kh?p v s?ng  N?u ???c ch? d?n, hy ch??m ? l?nh vo vng b? th??ng t?n. ?? lm ?i?u ny: ? N?u qu v? dng n?p ho?c ?ai ?eo tho ra ???c, hy tho n theo ch? d?n c?a chuyn gia ch?m [place] s?c kh?e. ? Cho ? l?nh vo ti ni lng. ? ?? kh?n t?m ? gi?a da v ti ch??m. ? Ch??m ? l?nh trong 20 pht, 2-3 l?n m?i ngy. ? B? ? l?nh ra n?u da qu v? chuy?n sang mu ?? t??i. ?i?u ny r?t quan tr?ng. N?u qu v? khng th? c?m th?y ?au, nng ho?c l?nh, qu v? c nguy c? cao h?n b? t?n th??ng vng ?.  C? ??ng cc ngn tay th??ng xuyn ?? lm gi?m c?ng kh?p v gi?m s?ng.   Ho?t ??ng  ?? khu?u tay v c? tay ngh?  ng?i v trnh cc ho?t ??ng gy ra cc tri?u ch?ng theo ch? d?n c?a chuyn gia ch?m [place] s?c kh?e.  T?p cc bi t?p v?t l tr? li?u theo ch? d?n c?a chuyn gia ch?m [place] s?c kh?e.  N?u qu v? nng ?? v?t, hy nng v?i lng bn tay h??ng ln trn. Nng theo cch ny lm gi?m s?c p ln khu?u tay. L?i s?ng  Trong tr??ng h?p khu?u tay qu?n v?t l do ch?i cc mn th? thao, hy ki?m tra d?ng c? c?a qu v? v ??m b?o r?ng: ? Qu v? s? d?ng n ?ng cch. ? N ph h?p v?i qu v?.  Trong tr??ng h?p khu?u tay qu?n v?t l do lm vi?c ho?c s? d?ng my vi tnh, hy ngh? ng?i th??ng xuyn ?? du?i cnh tay. Hy trao ??i v?i ch? lao ??ng c?a qu v? v? cch x? tr tnh tr?ng ny t?i n?i lm vi?c. H??ng d?n chung  Ch? s? d?ng thu?c khng k ??n v thu?c k ??n theo ch? d?n c?a chuyn gia ch?m [place] s?c kh?e.  Khng s? d?ng b?t k? s?n ph?m no c nicotine ho?c thu?c l. Nh?ng s?n ph?m ny bao g?m thu?c l d?ng ht, thu?c l d?ng nhai v d?ng c? ht thu?c, ch?ng h?n nh? thu?c l ?i?n t?. N?u qu v? c?n gip ?? ?? cai thu?c, hy h?i chuyn gia ch?m  s?c kh?e.  Tun th? theo t?t c? cc l?n khm l?i. ?i?u ny c vai tr quan tr?ng. Ng?n ng?a tnh tr?ng ny nh? th? no?  Tr??c v sau khi ho?t ??ng: ? Kh?i ??ng v ko gin tr??c khi ho?t ??ng. ? T?p ??ng tc ?i?u ha v ko gin sau khi hoa?t ??ng. ? Cho c? th? qu v? c th?i gian ngh? ng?i gi?a cc giai ?o?n ho?t ??ng.  Trong khi ho?t ??ng: ? Hy ch?c ch?n s? d?ng thi?t  b? ph h?p v?i qu v?. ? N?u qu v? ch?i qu?n v?t, hy dng ph?n c? th? d??i c?a qu v? ?? t?o l?c vo m?i l?n ?nh v?t. [place] s? d?ng duy nh?t cnh tay.  Duy tr t?p luy?n th? ch?t, bao g?m: ? S?c b?n. ? Kh? n?ng linh ho?t. ? ?? d?o dai.  T?p th? d?c ?? t?ng c??ng s?c b?n cho c? c?ng tay. Hy lin l?c v?i chuyn gia ch?m [place] s?c kh?e n?u:  Quy? vi? bi? ?au tr?m tr?ng h?n ho??c khng ??? khi ?i?u tr?Ladell Heads v? b? t b ho?c y?u ? c?ng tay, bn tay, ho?c ngn tay. Yu c?u tr? gip ngay  l?p t?c n?u:  ?au r?t d? d?i.  Qu v? khng th? c? ??ng c? tay. Tm t?t  Khu?u tay qu?n v?t (b?nh vim l?i c?u ngoi x??ng cnh tay) l vim gn trn c?ng tay pha ngoi, g?n khu?u tay qu v?.  Cc tri?u ch?ng th??ng g?p bao g?m ?au v nh?y c?m ?au ? c?ng tay v ph?n bn ngoi khu?u tay.  Tnh tr?ng ny th??ng do vi?c du?i c? tay, xoay c?ng tay v dng bn tay l?p ?i l?p l?i.  Cch ?i?u tr? ban ??u th??ng l ngh? ng?i v ch??m ? l?nh vo cnh tay ?? lm gi?m cc tri?u ch?ng. ?i?u tr? b? sung c th? bao g?m dng thu?c, v?t l tr? li?u, ?eo n?p ho?c ?ai ?eo, ho?c ph?u thu?t. Thng tin ny khng nh?m m?c ?ch thay th? cho l?i khuyn m chuyn gia ch?m [place] s?c kh?e ni v?i qu v?. Hy b?o ??m qu v? ph?i th?o lu?n b?t k? v?n ?? g m qu v? c v?i chuyn gia ch?m [place] s?c kh?e c?a qu v?. Document Revised: 04/17/2020 Document Reviewed: 04/17/2020 Elsevier Patient Education  2021 Elsevier Inc.  Ph?c h?i ch?c n?ng khu?u tay qu?n v?t [employer] [facility] h?i chuyn gia ch?m [place] s?c kh?e nh?ng bi t?p no an ton [employer] v?. T?p chnh xc nh? ch? d?n c?a chuyn gia ch?m [place] s?c kh?e v ?i?u ch?nh cc bi t?p ? theo h??ng d?n. C?m th?y c?ng c?, ko c?ng, t?c ho?c c?m gic kh ch?u nh? khi qu v? th?c hi?n cc bi t?p ny l bnh th??ng. D?ng ngay l?p t?c n?u qu v? c?m th?y ?au ??t ng?t ho?c ?au tr?m tr?ng h?n. Khng b?t ??u t?p nh?ng bi t?p ny cho ??n khi c h??ng d?n c?a chuyn gia ch?m [place] s?c kh?e c?a quy? vi?. Cc bi t?p gin c? v ph?m vi v?n ??ng Cc bi t?p ny lm kh?i ??ng c? v kh?p v c?i thi?n c? ??ng v kh? n?ng linh ho?t c?a khu?u tay. G?p c? tay, c h? tr? 1. Du?i th?ng khu?u tay tri/ph?i tr??c m?t qu v? v?i lng bn tay p xu?ng v? pha sn nh. ? N?u chuyn gia ch?m [place] s?c kh?e ch? d?n, hy g?p khu?u tay tri/ph?i theo gc 90 ?? (gc ph?i) ? bn hng qu v? thay v gi? th?ng. 2. Dng tay kia, nh? nhng ??y ln mu bn tay tri/ph?i ?? cc ngn tay h??ng v? pha sn nh  (g?p). D?ng l?i khi qu v? c?m th?y h?i c?ng ? pha sau c?a c?ng tay. 3. Gi? nguyn t? th? ny trong __________ giy. L?p l?i __________ l?n. Hon t?t bi t?p ny __________ l?n m?i ngy.   Du?i c? tay, c tr? gip 1. Du?i th?ng khu?u tay tri/ph?i tr??c m?t qu v? v?i  lng bn tay h??ng ln v? pha tr?n nh. ? N?u chuyn gia ch?m [place] s?c kh?e ch? d?n, hy g?p khu?u tay tri/ph?i theo gc 90 ?? (gc ph?i) ? bn hng qu v? thay v gi? th?ng. 2. Dng bn tay kia, nh? nhng ko bn tay tri/ph?i v cc ngn tay v? pha sn nh (du?i). D?ng l?i khi qu v? c?m th?y h?i c?ng ? pha lng bn tay c?a c?ng tay. 3. Gi? nguyn t? th? ny trong __________ giy. L?p l?i __________ l?n. Hon t?t bi t?p ny __________ l?n m?i ngy.   Xoay, l?t ng?a cnh tay c h? tr? 1. Ng?i ho?c ??ng v?i cc khu?u tay ? bn c?nh qu v?. 2. G?p khu?u tay tri/ph?i theo gc 90 ?? (gc ph?i). 3. Dng tay khng b? th??ng, xoay lng bn tay tri/ph?i v? pha tr?n nh (l?t ng?a) cho ??n khi qu v? c?m th?y c?ng nh? d?c theo m?t trong c?ng tay. 4. Gi? nguyn t? th? ny trong __________ giy. L?p l?i __________ l?n. Hon t?t bi t?p ny __________ l?n m?i ngy. Rennis GoldenXoay, quay s?p cnh tay c h? tr? 1. Ng?i ho?c ??ng v?i cc khu?u tay ? bn c?nh qu v?. 2. G?p khu?u tay tri/ph?i theo gc 90 ?? (gc ph?i). 3. Dng tay khng b? th??ng, xoay lng bn tay tri/ph?i p xu?ng v? pha sn nh (quay s?p) cho ??n khi qu v? c?m th?y c?ng nh? d?c theo bn ngoi c?ng tay. 4. Gi? nguyn t? th? ny trong __________ giy. L?p l?i __________ l?n. Hon t?t bi t?p ny __________ l?n m?i ngy. Cc bi t?p t?ng c??ng s?c m?nh Cc bi t?p ny xy d?ng s?c m?nh v s?c b?n ? c?ng tay v khu?u tay c?a qu v?. S?c b?n l kh? n?ng s? d?ng c? c?a qu v? trong th?i gian di, ngay c? sau khi cc c? ? m?i. Quay l?ch tm 1. ??ng v?i m?t qu? t? __________ ho?c m?t ci ba ? tay tri/ph?i. Ho?c ng?i trong khi c?m b?ng ho?c ?ng t?p b?ng cao su, v?i c?ng tay  tri/ph?i ???c ?? trn bn ho?c m?t qu?y. ? ??t c?ng tay sao cho ngn tay ci h??ng v? pha tr?n nh, nh? th? qu v? ??nh v? tay. ?y l v? tr trung gian. 2. Nh?c bn tay ln pha tr??c ?? ngn tay ci di chuy?n v? pha tr?n nh (quay l?ch tm), ho?c ko ln ?ng b?ng cao su. Gi? yn c?ng tay v khu?u tay c?a qu v? trong khi qu v? ch? c?? ??ng c? tay. 3. Gi? nguyn t? th? ny trong __________ giy. 4. T? t? tr? v? t? th? b?t ??u. L?p l?i __________ l?n. Hon t?t bi t?p ny __________ l?n m?i ngy.   Du?i c? tay, l?ch tm 1. Ng?i v?i lng c?ng tay tay tri/ph?i c?a qu v? p xu?ng v ???c ?? trn bn ho?c b? m?t khc. ?? c? tay tri/ph?i c?a qu v? v??n qua mp c?a b? m?t ?. 2. Gi? m?t qu? t? __________ ho?c m?t m?nh b?ng ho?c ?ng t?p ? bn tay tri/ph?i. ? N?u s? d?ng b?ng ho?c ?ng t?p b?ng cao su, gi? ??u kia c?a ?ng b?ng tay cn l?i. 3. S? d?ng bn tay khng b? th??ng ?? di chuy?n tay tri/ph?i c?a qu v? ln trn v? pha tr?n nh. 4. L?y tay khng b? th??ng c?a qu v? ra v t? t? tr? v? t? th? b?t ??u ch? b?ng tay tri/tay ph?i. H? th?p cnh tay xu?ng  trong khi du?i c?ng ???c g?i l du?i l?ch tm. L?p l?i __________ l?n. Hon t?t bi t?p ny __________ l?n m?i ngy. Du?i c? tay Khng t?p bi t?p ny n?u n gy ?au ? bn ngoi khu?u tay. Ch? th?c hi?n bi t?p ny khi ???c h??ng d?n b?i chuyn gia ch?m [place] s?c kh?e. 1. Ng?i v?i c?ng tay tri/ph?i c?a qu v? ???c ?? trn bn ho?c b? m?t khc v lng bn tay c?a qu v? h??ng xu?ng pha sn nh. ?? c? tay tri/ph?i c?a qu v? v??n qua mp c?a b? m?t ?. 2. Gi? m?t qu? t? __________ ho?c m?t m?nh b?ng ho?c ?ng t?p b?ng cao su. ? N?u qu v? ?ang s? d?ng b?ng ho?c ?ng t?p b?ng cao su, gi? b?ng ho?c ?ng t?i ch? b?ng tay kia ?? t?o l?c c?n. 3. T? t? b? c? tay sao cho bn tay qu v? di chuy?n ln v? pha tr?n nh (du?i). Ch? c?? ??ng c? tay, gi?? yn c?ng tay v khu?u tay. 4. Gi? nguyn t? th? ny trong __________ giy. 5. T? t? tr? v? t? th? b?t  ??u. L?p l?i __________ l?n. Hon t?t bi t?p ny __________ l?n m?i ngy.   Tawanna Cooler, l?t ng?a c?ng tay ?? th?c hi?n bi t?p ny, qu v? s? c?n m?t ci ba nh? ho?c ba cao su. 1. Ng?i v?i lng c?ng tay tay tri/ph?i c?a qu v? ? trn bn ho?c b? m?t khc. G?p khu?u tay theo gc 90 ?? (gc ph?i). ??nh v? c?ng tay sao cho lng bn tay c?a qu v? h??ng xu?ng v? pha sn nh, bn tay t? ln mp bn. 2. Dng tay tri/ph?i ?? c?m ba. ? ?? th?c hi?n bi t?p ny d? dng h?n, hy c?m ba g?n ??u ba. ? ?? th?c hi?n bi t?p ny kh h?n, hy c?m ba g?n cu?i cn ba. 3. Khng c? ??ng c? tay ho?c khu?u tay, t? t? xoay c?ng tay c?a qu v? ?? lng bn tay h??ng ln pha tr?n nh (l?t ng?a). 4. Gi? nguyn t? th? ny trong __________ giy. 5. T? t? tr? v? t? th? b?t ??u. L?p l?i __________ l?n. Hon t?t bi t?p ny __________ l?n m?i ngy.   Bp x??ng b? vai 1. Ng?i trn m?t chi?c gh? ?n ??nh ho?c ??ng v?i t? th? ?ng. N?u qu v? ng?i xu?ng, khng ?? l?ng ch?m vo l?ng gh?. 2. Hai cnh tay ph?i ? hai bn v?i khu?u tay g?p theo gc 90 ?? (gc ph?i). ??t hai c?ng tay sao cho cc ngn tay ci c?a qu v? h??ng v? pha tr?n nh (v? tr trung gian). 3. Khng nng vai ln, hy bp ch?t cc x??ng b? vai vo nhau. 4. Gi? nguyn t? th? ny trong __________ giy. 5. T? t? th? ra v tr? v? v? tr b?t ??u. L?p l?i __________ l?n. Hon t?t bi t?p ny __________ l?n m?i ngy. Thng tin ny khng nh?m m?c ?ch thay th? cho l?i khuyn m chuyn gia ch?m [place] s?c kh?e ni v?i qu v?. Hy b?o ??m qu v? ph?i th?o lu?n b?t k? v?n ?? g m qu v? c v?i chuyn gia ch?m [place] s?c kh?e c?a qu v?. Document Revised: 12/10/2019 Document Reviewed: 12/10/2019 Elsevier Patient Education  2021 [employer].

## 2021-03-11 NOTE — Progress Notes (Deleted)
    SUBJECTIVE:   CHIEF COMPLAINT / HPI:   ***  PERTINENT  PMH / PSH: ***  OBJECTIVE:   There were no vitals taken for this visit.  ***  ASSESSMENT/PLAN:   No problem-specific Assessment & Plan notes found for this encounter.     Achaia Garlock Autry-Lott, DO [facility]   {    This will disappear when note is signed, click to select method of visit    :1}  

## 2021-03-12 ENCOUNTER — Other Ambulatory Visit: Payer: Self-pay

## 2021-03-12 ENCOUNTER — Ambulatory Visit: Payer: Medicaid Other | Admitting: Family Medicine

## 2022-01-23 NOTE — Progress Notes (Signed)
? ? ?SUBJECTIVE:  ? ?Chief compliant/HPI: annual examination ? ?Sarah Day is a 31 y.o. who presents today for an annual exam.  ? ?Concerns:  ?Endorsing dysuria with every trip to the bathroom for a few days. Denies fever, low back pain, and suprapubic tenderness.  ?Endorsing seasonal allergies without current treatment. Her throat is itchy. She denies other sick symptoms.  ? ?OBJECTIVE:  ? ?BP 105/75   Pulse 91   Temp 98.7 ?F (37.1 ?C)   Wt 154 lb (69.9 kg)   LMP 12/15/2021   SpO2 98%   BMI 27.28 kg/m?  ?Physical Exam ?Vitals reviewed.  ?Constitutional:   ?   General: She is not in acute distress. ?   Appearance: She is not ill-appearing, toxic-appearing or diaphoretic.  ?HENT:  ?   Right Ear: Tympanic membrane normal.  ?   Left Ear: Tympanic membrane normal.  ?   Nose: No congestion or rhinorrhea.  ?   Mouth/Throat:  ?   Pharynx: No oropharyngeal exudate or posterior oropharyngeal erythema.  ?Eyes:  ?   Conjunctiva/sclera: Conjunctivae normal.  ?Cardiovascular:  ?   Rate and Rhythm: Normal rate and regular rhythm.  ?   Heart sounds: Normal heart sounds.  ?Pulmonary:  ?   Effort: Pulmonary effort is normal.  ?   Breath sounds: Normal breath sounds.  ?Abdominal:  ?   General: There is no distension.  ?   Palpations: Abdomen is soft.  ?   Tenderness: There is no abdominal tenderness. There is no right CVA tenderness, left CVA tenderness or guarding.  ?Musculoskeletal:  ?   Cervical back: Neck supple.  ?Lymphadenopathy:  ?   Cervical: No cervical adenopathy.  ?Neurological:  ?   Mental Status: She is alert and oriented to person, place, and time.  ?Psychiatric:     ?   Mood and Affect: Mood normal.     ?   Behavior: Behavior normal.  ? ? ?  ? ?ASSESSMENT/PLAN:  ? ?Dysuria ?Several days of UTI symptoms. No systemic symptoms. Urine sample given, and orders placed during encounter as below but was not ran by lab or noticed until the following day. Treatment was sent in during encounter. Collect urine sample again  if symptoms fail to resolve after antibiotics.  ?- Keflex ?- POCT urinalysis dipstick ?- Urine Culture ? ?Seasonal allergies ?- fluticasone (FLONASE) 50 MCG/ACT nasal spray; Place 2 sprays into both nostrils daily.  Dispense: 16 g; Refill: 6 ?- cetirizine (ZYRTEC ALLERGY) 10 MG tablet; Take 1 tablet (10 mg total) by mouth daily.  Dispense: 90 tablet; Refill: 0 ? ?Annual Examination  ?See AVS for age appropriate recommendations.  ? ?PHQ score 0, reviewed and discussed. ?Blood pressure reviewed and at goal.  ?Asked about intimate partner violence and patient reports safe.  ?The patient currently uses nothing for contraception. Declined.  ? ?Considered the following items based upon USPSTF recommendations: ?HIV testing:  Prior reviewed, neg ?Hepatitis C:  Prior reviewed, neg ?Hepatitis B:  Prior reviewed, neg ?Syphilis if at high risk:  Not high risk ?GC/CT not at high risk and not ordered. ?Lipid panel (nonfasting or fasting) discussed based upon AHA recommendations and ordered.  Consider repeat every 4-6 years.  ?Reviewed risk factors for latent tuberculosis and not indicated ? ?Discussed family history, BRCA testing not indicated. Tool used to risk stratify was Pedigree Assessment tool.  ?Cervical cancer screening: prior Pap reviewed, repeat due in 2026 ?Immunizations- due for COVID booster; declined  ? ?Follow up in 1  year or sooner if indicated.  ? ? ?Trusten Hume Autry-Lott, DO ?[place]  ? ?

## 2022-01-24 ENCOUNTER — Ambulatory Visit (INDEPENDENT_AMBULATORY_CARE_PROVIDER_SITE_OTHER): Payer: 59 | Admitting: Family Medicine

## 2022-01-24 ENCOUNTER — Encounter: Payer: Self-pay | Admitting: Family Medicine

## 2022-01-24 VITALS — BP 105/75 | HR 91 | Temp 98.7°F | Wt 154.0 lb

## 2022-01-24 DIAGNOSIS — R300 Dysuria: Secondary | ICD-10-CM

## 2022-01-24 DIAGNOSIS — Z0000 Encounter for general adult medical examination without abnormal findings: Secondary | ICD-10-CM

## 2022-01-24 DIAGNOSIS — J302 Other seasonal allergic rhinitis: Secondary | ICD-10-CM | POA: Diagnosis not present

## 2022-01-24 MED ORDER — CEPHALEXIN 500 MG PO CAPS: 500.0000 mg | ORAL_CAPSULE | Freq: Two times a day (BID) | ORAL | 0 refills | Status: AC

## 2022-01-24 NOTE — Patient Instructions (Signed)
Preventive Care 21-32 Years Old, Female ?Preventive care refers to lifestyle choices and visits with your health care provider that can promote health and wellness. Preventive care visits are also called wellness exams. ?What can I expect for my preventive care visit? ?Counseling ?During your preventive care visit, your health care provider may ask about your: ?Medical history, including: ?Past medical problems. ?Family medical history. ?Pregnancy history. ?Current health, including: ?Menstrual cycle. ?Method of birth control. ?Emotional well-being. ?Home life and relationship well-being. ?Sexual activity and sexual health. ?Lifestyle, including: ?Alcohol, nicotine or tobacco, and drug use. ?Access to firearms. ?Diet, exercise, and sleep habits. ?Work and work environment. ?Sunscreen use. ?Safety issues such as seatbelt and bike helmet use. ?Physical exam ?Your health care provider may check your: ?Height and weight. These may be used to calculate your BMI (body mass index). BMI is a measurement that tells if you are at a healthy weight. ?Waist circumference. This measures the distance around your waistline. This measurement also tells if you are at a healthy weight and may help predict your risk of certain diseases, such as type 2 diabetes and high blood pressure. ?Heart rate and blood pressure. ?Body temperature. ?Skin for abnormal spots. ?What immunizations do I need? ? ?Vaccines are usually given at various ages, according to a schedule. Your health care provider will recommend vaccines for you based on your age, medical history, and lifestyle or other factors, such as travel or where you work. ?What tests do I need? ?Screening ?Your health care provider may recommend screening tests for certain conditions. This may include: ?Pelvic exam and Pap test. ?Lipid and cholesterol levels. ?Diabetes screening. This is done by checking your blood sugar (glucose) after you have not eaten for a while (fasting). ?Hepatitis  B test. ?Hepatitis C test. ?HIV (human immunodeficiency virus) test. ?STI (sexually transmitted infection) testing, if you are at risk. ?BRCA-related cancer screening. This may be done if you have a family history of breast, ovarian, tubal, or peritoneal cancers. ?Talk with your health care provider about your test results, treatment options, and if necessary, the need for more tests. ?Follow these instructions at home: ?Eating and drinking ? ?Eat a healthy diet that includes fresh fruits and vegetables, whole grains, lean protein, and low-fat dairy products. ?Take vitamin and mineral supplements as recommended by your health care provider. ?Do not drink alcohol if: ?Your health care provider tells you not to drink. ?You are pregnant, may be pregnant, or are planning to become pregnant. ?If you drink alcohol: ?Limit how much you have to 0-1 drink a day. ?Know how much alcohol is in your drink. In the [place] one drink equals one 12 oz bottle of beer (355 mL), one 5 oz glass of wine (148 mL), or one 1? oz glass of hard liquor (44 mL). ?Lifestyle ?Brush your teeth every morning and night with fluoride toothpaste. Floss one time each day. ?Exercise for at least 30 minutes 5 or more days each week. ?Do not use any products that contain nicotine or tobacco. These products include cigarettes, chewing tobacco, and vaping devices, such as e-cigarettes. If you need help quitting, ask your health care provider. ?Do not use drugs. ?If you are sexually active, practice safe sex. Use a condom or other form of protection to prevent STIs. ?If you do not wish to become pregnant, use a form of birth control. If you plan to become pregnant, see your health care provider for a prepregnancy visit. ?Find healthy ways to manage stress, such as: ?Meditation,   yoga, or listening to music. ?Journaling. ?Talking to a trusted person. ?Spending time with friends and family. ?Minimize exposure to UV radiation to reduce your risk of skin  cancer. ?Safety ?Always wear your seat belt while driving or riding in a vehicle. ?Do not drive: ?If you have been drinking alcohol. Do not ride with someone who has been drinking. ?If you have been using any mind-altering substances or drugs. ?While texting. ?When you are tired or distracted. ?Wear a helmet and other protective equipment during sports activities. ?If you have firearms in your house, make sure you follow all gun safety procedures. ?Seek help if you have been physically or sexually abused. ?What's next? ?Go to your health care provider once a year for an annual wellness visit. ?Ask your health care provider how often you should have your eyes and teeth checked. ?Stay up to date on all vaccines. ?This information is not intended to replace advice given to you by your health care provider. Make sure you discuss any questions you have with your health care provider. ?Document Revised: 02/28/2021 Document Reviewed: 02/28/2021 ?Elsevier Patient Education ? [place]. ? ?

## 2022-01-25 LAB — LIPID PANEL
Chol/HDL Ratio: 2.9 ratio (ref 0.0–4.4)
Cholesterol, Total: 129 mg/dL (ref 100–199)
HDL: 44 mg/dL (ref >39)
LDL Chol Calc (NIH): 67 mg/dL (ref 0–99)
Triglycerides: 98 mg/dL (ref 0–149)
VLDL Cholesterol Cal: 18 mg/dL (ref 5–40)

## 2022-01-27 MED ORDER — FLUTICASONE PROPIONATE 50 MCG/ACT NA SUSP: 2.0000 | Freq: Every day | NASAL | 6 refills | Status: AC

## 2022-01-27 MED ORDER — CETIRIZINE HCL 10 MG PO TABS: 10.0000 mg | ORAL_TABLET | Freq: Every day | ORAL | 0 refills | Status: AC

## 2022-01-28 ENCOUNTER — Encounter: Payer: Self-pay | Admitting: Family Medicine

## 2022-09-13 ENCOUNTER — Ambulatory Visit: Payer: Commercial Managed Care - HMO | Admitting: Family Medicine

## 2022-09-13 VITALS — BP 126/82 | HR 77 | Temp 97.9°F | Ht 63.0 in | Wt 156.2 lb

## 2022-09-13 DIAGNOSIS — J329 Chronic sinusitis, unspecified: Secondary | ICD-10-CM | POA: Diagnosis not present

## 2022-09-13 DIAGNOSIS — B9689 Other specified bacterial agents as the cause of diseases classified elsewhere: Secondary | ICD-10-CM | POA: Diagnosis not present

## 2022-09-13 MED ORDER — BENZONATATE 100 MG PO CAPS: 100.0000 mg | ORAL_CAPSULE | Freq: Two times a day (BID) | ORAL | 0 refills | Status: AC | PRN

## 2022-09-13 MED ORDER — AMOXICILLIN-POT CLAVULANATE 875-125 MG PO TABS: 1.0000 | ORAL_TABLET | Freq: Two times a day (BID) | ORAL | 0 refills | Status: AC

## 2022-09-13 NOTE — Progress Notes (Signed)
    SUBJECTIVE:   CHIEF COMPLAINT / HPI:  Chief Complaint  Patient presents with   Sore Throat   Headache   Nasal Congestion   Fatigue    In-person interpretor present  Patient reports she started feeling ill 2 weeks ago with cough, sore throat, congestion, and headache. She noted a subjective fever 1 week ago which resolved 3 days ago. Eating and drinking OK. She has tried tea with honey and cough drops without significant relief. Denies chest pain, SOB. No known sick contacts.  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    Appearance: Normal appearance.  HENT:     Head: Normocephalic and atraumatic.     Comments: Mild bilateral frontal sinus tenderness.  No maxillary sinus tenderness.    Nose: Rhinorrhea present.     Mouth/Throat:     Mouth: Mucous membranes are moist.     Pharynx: Oropharynx is clear. No oropharyngeal exudate or posterior oropharyngeal erythema.  Eyes:     General: No scleral icterus.       Right eye: No discharge.        Left eye: No discharge.     Extraocular Movements: Extraocular movements intact.  Cardiovascular:     Rate and Rhythm: Normal rate and regular rhythm.     Heart sounds: Normal heart sounds.  Pulmonary:     Effort: Pulmonary effort is normal. No respiratory distress.     Breath sounds: Normal breath sounds.  Musculoskeletal:     Cervical back: Neck supple.  Neurological:     Mental Status: She is alert.         09/13/2022   10:59 AM  Depression screen PHQ 2/9  Decreased Interest 0  Down, Depressed, Hopeless 0  PHQ - 2 Score 0  Altered sleeping 0  Tired, decreased energy 1  Change in appetite 0   Feeling bad or failure about yourself  0  Trouble concentrating 0  Moving slowly or fidgety/restless 0  Suicidal thoughts 0  PHQ-9 Score 1  Difficult doing work/chores Not difficult at all     {Show previous vital signs (optional):23777}    ASSESSMENT/PLAN:   Bacterial sinusitis  Patient presenting with 2 weeks of upper respiratory symptoms cough and sore throat also with recent fever 1 week into illness that has now resolved.  Given time course and worsening of symptoms we will treat for bacterial sinusitis. - amoxicillin clavulanate BID x 7d - benzonatate prn - Tylenol/ibuprofen for sore throat - supportive care  Return if symptoms worsen or fail to improve.   4/9, MD [facility]

## 2022-09-13 NOTE — Patient Instructions (Addendum)
It was nice seeing you today!  Take antibiotics as prescribed.  You can try Tessalon as needed for cough.  I recommend Tylenol and/or ibuprofen for sore throat.  Stay well, Littie Deeds, MD [facility] [phone]  --  Make sure to check out at the front desk before you leave today.  Please arrive at least 15 minutes prior to your scheduled appointments.  If you had blood work today, I will send you a MyChart message or a letter if results are normal. Otherwise, I will give you a call.  If you had a referral placed, they will call you to set up an appointment. Please give [place] a call if you don't hear back in the next 2 weeks.  If you need additional refills before your next appointment, please call your pharmacy first.
